# Patient Record
Sex: Male | Born: 1960 | Race: Black or African American | Hispanic: No | Marital: Married | Smoking: Never smoker
Health system: Southern US, Community
[De-identification: ages and names within clinical notes are randomized; demographics above are authoritative.]

## PROBLEM LIST (undated history)

## (undated) DIAGNOSIS — I10 Essential (primary) hypertension: Secondary | ICD-10-CM

## (undated) DIAGNOSIS — E785 Hyperlipidemia, unspecified: Secondary | ICD-10-CM

## (undated) HISTORY — DX: Hyperlipidemia, unspecified: E78.5

## (undated) HISTORY — PX: HERNIA REPAIR: SHX51

## (undated) HISTORY — DX: Essential (primary) hypertension: I10

---

## 2008-02-27 ENCOUNTER — Ambulatory Visit (HOSPITAL_BASED_OUTPATIENT_CLINIC_OR_DEPARTMENT_OTHER): Admission: RE | Admit: 2008-02-27 | Discharge: 2008-02-27 | Payer: Self-pay | Admitting: General Surgery

## 2010-06-07 ENCOUNTER — Encounter: Payer: Self-pay | Admitting: Orthopedic Surgery

## 2010-09-28 NOTE — Op Note (Signed)
NAMETanveer, Brammer Haidyn                   ACCOUNT NO.:  [id number]   MEDICAL RECORD NO.:  [id number]          PATIENT TYPE:  AMB   LOCATION:  DSC                          FACILITY:  [facility]   PHYSICIAN:  Leonie Man, M.D.   DATE OF BIRTH:  1960/11/09   DATE OF PROCEDURE:  02/27/2008  DATE OF DISCHARGE:                               OPERATIVE REPORT   PREOPERATIVE DIAGNOSIS:  Recurrent right inguinal hernia.   POSTOPERATIVE DIAGNOSIS:  Recurrent right inguinal hernia.   PROCEDURE:  Repair of recurrent right inguinal hernia with mesh.   SURGEON:  Leonie Man, MD   ASSISTANT:  OR nurse.   ANESTHESIA:  General.   There were no specimens sent to the lab.   FINDINGS:  Recurrent right inguinal hernia at the area of the pubic  tubercle with annoying incarcerated bowel but incarcerated preperitoneal  fat.  The original repair was not done with mesh.   Estimated blood loss is minimal.   There were no complications noted during this operation.   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  He has  noted a right-sided groin bulge, which is minimally symptomatic at this  point and he comes for evaluation and treatment.  He understands the  risks and potential benefits of surgery, gives his consent for  operation.   PROCEDURE:  The patient was positioned supine, and following the  induction of satisfactory general anesthesia, the lower abdomen and  groin were prepped and draped to be included in a sterile operative  field.  Identification of the patient as Houston Zapien and the procedure is  to be done as right inguinal hernia was carried out.  Preoperative  prophylactic antibiotics were given.  I made a transversely-placed right  groin incision through the old scar cicatrix dissecting through this  down to the external oblique aponeurosis.  The external oblique  aponeurosis was opened up through the  external inguinal ring with  isolation of the spermatic cord.  The spermatic cord was dissected free  up from the floor of the inguinal canal and held with a Penrose drain.  The incarcerated portion of the hernia was the most distal tip near the  pubic tubercle.  I had to remove a few of the repairing stitches so as  to reduce the hernia back into the retroperitoneum.  This area was then  closed primarily with a running suture of 2-0 Novofil.  I then placed an  onlay patch over the hernia and sewed this in, starting at the pubic  tubercle carrying this along the conjoined tendon up to the internal  ring and again from the pubic tubercle up along the shelving edge of  Poupart's ligament up to the internal ring.  The tails of the mesh were  then split and so as to allow passage of the spermatic cord between the  leaflets.  The leaflets of the mesh were then placed up under the  external oblique aponeurosis on top of the internal oblique and then  sewn into the internal oblique with additional sutures.  Sponge,  instrument, and sharp counts were then verified.  I closed the external  oblique aponeurosis over the cord with a running 2-0 Vicryl sutures so  as to  reapproximate the external inguinal ring.  The subcutaneous tissues and  Scarpa fascia was closed with running 3-0 Vicryl suture and the skin was  closed with running 4-0 Monocryl suture and then reinforced with Steri-  Strips and Dermabond.      Leonie Man, M.D.  Electronically Signed     PB/MEDQ  D:  02/27/2008  T:  02/27/2008  Job:  [id number]

## 2011-02-15 LAB — POCT I-STAT, CHEM 8
BUN: 21
Calcium, Ion: 1.21
Creatinine, Ser: 1.2
Glucose, Bld: 91
HCT: 47
Potassium: 4.4

## 2011-12-13 ENCOUNTER — Other Ambulatory Visit: Payer: Self-pay | Admitting: Internal Medicine

## 2011-12-13 DIAGNOSIS — N63 Unspecified lump in unspecified breast: Secondary | ICD-10-CM

## 2011-12-13 DIAGNOSIS — N644 Mastodynia: Secondary | ICD-10-CM

## 2011-12-20 ENCOUNTER — Ambulatory Visit
Admission: RE | Admit: 2011-12-20 | Discharge: 2011-12-20 | Disposition: A | Discharge status: Home / Self Care | Payer: BC Managed Care – PPO | Source: Ambulatory Visit | Attending: Internal Medicine | Admitting: Internal Medicine

## 2011-12-20 DIAGNOSIS — N644 Mastodynia: Secondary | ICD-10-CM

## 2011-12-20 DIAGNOSIS — N63 Unspecified lump in unspecified breast: Secondary | ICD-10-CM

## 2013-08-29 ENCOUNTER — Encounter (HOSPITAL_COMMUNITY): Payer: Self-pay | Admitting: Emergency Medicine

## 2013-08-29 ENCOUNTER — Inpatient Hospital Stay (HOSPITAL_COMMUNITY)
Admission: EM | Admit: 2013-08-29 | Discharge: 2013-09-01 | DRG: 700 | Disposition: A | Discharge status: Home / Self Care | Payer: BC Managed Care – PPO | Attending: Internal Medicine | Admitting: Internal Medicine

## 2013-08-29 DIAGNOSIS — N280 Ischemia and infarction of kidney: Secondary | ICD-10-CM | POA: Diagnosis present

## 2013-08-29 DIAGNOSIS — E785 Hyperlipidemia, unspecified: Secondary | ICD-10-CM | POA: Diagnosis present

## 2013-08-29 DIAGNOSIS — R030 Elevated blood-pressure reading, without diagnosis of hypertension: Secondary | ICD-10-CM

## 2013-08-29 DIAGNOSIS — IMO0001 Reserved for inherently not codable concepts without codable children: Secondary | ICD-10-CM | POA: Diagnosis present

## 2013-08-29 DIAGNOSIS — R509 Fever, unspecified: Secondary | ICD-10-CM

## 2013-08-29 DIAGNOSIS — I10 Essential (primary) hypertension: Secondary | ICD-10-CM | POA: Diagnosis present

## 2013-08-29 DIAGNOSIS — N2889 Other specified disorders of kidney and ureter: Principal | ICD-10-CM | POA: Diagnosis present

## 2013-08-29 LAB — CBC WITH DIFFERENTIAL/PLATELET
Basophils Absolute: 0 10*3/uL (ref 0.0–0.1)
Basophils Relative: 0 % (ref 0–1)
EOS ABS: 0 10*3/uL (ref 0.0–0.7)
EOS PCT: 0 % (ref 0–5)
HCT: 44.2 % (ref 39.0–52.0)
Hemoglobin: 14.8 g/dL (ref 13.0–17.0)
LYMPHS ABS: 1.2 10*3/uL (ref 0.7–4.0)
LYMPHS PCT: 11 % — AB (ref 12–46)
MCH: 27.3 pg (ref 26.0–34.0)
MCHC: 33.5 g/dL (ref 30.0–36.0)
MCV: 81.5 fL (ref 78.0–100.0)
Monocytes Absolute: 0.3 10*3/uL (ref 0.1–1.0)
Monocytes Relative: 2 % — ABNORMAL LOW (ref 3–12)
NEUTROS PCT: 87 % — AB (ref 43–77)
Neutro Abs: 9.4 10*3/uL — ABNORMAL HIGH (ref 1.7–7.7)
PLATELETS: 200 10*3/uL (ref 150–400)
RBC: 5.42 MIL/uL (ref 4.22–5.81)
RDW: 14.5 % (ref 11.5–15.5)
WBC: 10.9 10*3/uL — AB (ref 4.0–10.5)

## 2013-08-29 LAB — URINALYSIS, ROUTINE W REFLEX MICROSCOPIC
BILIRUBIN URINE: NEGATIVE
GLUCOSE, UA: NEGATIVE mg/dL
HGB URINE DIPSTICK: NEGATIVE
Ketones, ur: NEGATIVE mg/dL
Leukocytes, UA: NEGATIVE
NITRITE: NEGATIVE
PH: 5.5 (ref 5.0–8.0)
Protein, ur: NEGATIVE mg/dL
Specific Gravity, Urine: 1.028 (ref 1.005–1.030)
Urobilinogen, UA: 0.2 mg/dL (ref 0.0–1.0)

## 2013-08-29 LAB — COMPREHENSIVE METABOLIC PANEL
ALBUMIN: 4.1 g/dL (ref 3.5–5.2)
ALK PHOS: 55 U/L (ref 39–117)
ALT: 19 U/L (ref 0–53)
AST: 24 U/L (ref 0–37)
BILIRUBIN TOTAL: 0.3 mg/dL (ref 0.3–1.2)
BUN: 14 mg/dL (ref 6–23)
CHLORIDE: 97 meq/L (ref 96–112)
CO2: 23 mEq/L (ref 19–32)
Calcium: 9.5 mg/dL (ref 8.4–10.5)
Creatinine, Ser: 1.13 mg/dL (ref 0.50–1.35)
GFR calc Af Amer: 85 mL/min — ABNORMAL LOW (ref >90)
GFR, EST NON AFRICAN AMERICAN: 73 mL/min — AB (ref >90)
Glucose, Bld: 111 mg/dL — ABNORMAL HIGH (ref 70–99)
Potassium: 3.8 mEq/L (ref 3.7–5.3)
SODIUM: 137 meq/L (ref 137–147)
TOTAL PROTEIN: 7.7 g/dL (ref 6.0–8.3)

## 2013-08-29 MED ORDER — ONDANSETRON 4 MG PO TBDP
8.0000 mg | ORAL_TABLET | Freq: Once | ORAL | Status: AC
  Administered 2013-08-29: 8 mg via ORAL
  Filled 2013-08-29: qty 2

## 2013-08-29 MED ORDER — SODIUM CHLORIDE 0.9 % IV BOLUS (SEPSIS)
1000.0000 mL | Freq: Once | INTRAVENOUS | Status: AC
  Administered 2013-08-29: 1000 mL via INTRAVENOUS

## 2013-08-29 MED ORDER — OXYCODONE-ACETAMINOPHEN 5-325 MG PO TABS
1.0000 | ORAL_TABLET | Freq: Once | ORAL | Status: AC
  Administered 2013-08-29: 1 via ORAL
  Filled 2013-08-29: qty 1

## 2013-08-29 MED ORDER — MORPHINE SULFATE 4 MG/ML IJ SOLN
4.0000 mg | Freq: Once | INTRAMUSCULAR | Status: AC
  Administered 2013-08-29: 4 mg via INTRAVENOUS
  Filled 2013-08-29: qty 1

## 2013-08-29 MED ORDER — IOHEXOL 300 MG/ML  SOLN
25.0000 mL | Freq: Once | INTRAMUSCULAR | Status: AC | PRN
  Administered 2013-08-30: 25 mL via ORAL

## 2013-08-29 NOTE — ED Provider Notes (Addendum)
CSN: [id number]     Arrival date & time 08/29/13  1651 History   First MD Initiated Contact with Patient 08/29/13 2307     Chief Complaint  Patient presents with  . Abdominal Pain     (Consider location/radiation/quality/duration/timing/severity/associated sxs/prior Treatment) HPI This patient is a 53 year old man who presents with increasingly severe right lower quadrant abdominal pain which began about 12 h ours ago.   The pain is currently 10 over 10 in severity. The pain is nonradiating. The patient hasn't been nauseated but denies vomiting. His appetite has been very diminished. Less by mouth intake with breakfast. She has not appreciated a fever. Denies dysuria. No diarrhea or change in stools.  Nothing makes pain worse or better. Patient denies history of similar symptoms. Only previous abdominal surgery is a right inguinal hernia repair.  History reviewed. No pertinent past medical history. Past Surgical History  Procedure Laterality Date  . Hernia repair     History reviewed. No pertinent family history. History  Substance Use Topics  . Smoking status: Never Smoker   . Smokeless tobacco: Not on file  . Alcohol Use: No    Review of Systems Ten point review of symptoms performed and is negative with the exception of symptoms noted above.     Allergies  Review of patient's allergies indicates no known allergies.  Home Medications   Prior to Admission medications   Medication Sig Start Date End Date Taking? Authorizing Provider  Ascorbic Acid (VITAMIN C) 250 MG CHEW Chew 3 tablets by mouth daily.   Yes Historical Provider, MD  Multiple Vitamin (MULTI-VITAMIN PO) Take 2 tablets by mouth 2 (two) times daily. "Alleviate"   Yes Historical Provider, MD   BP 180/104  Pulse 77  Temp(Src) 99.2 F (37.3 C) (Oral)  Resp 20  Ht 6\' 6"  (1.981 m)  Wt 242 lb (109.77 kg)  BMI 27.97 kg/m2  SpO2 99% Physical Exam Gen: well developed and well nourished appearing man appears  mildly uncomfortable Head: NCAT Eyes: PERL, EOMI Nose: no epistaixis or rhinorrhea Mouth/throat: mucosa is moist and pink Neck: supple, no stridor Lungs: CTA B, no wheezing, rhonchi or rales CV: RRR, no murmur, extremities appear well perfused.  Abd: soft, tenderness over McBurney's point, no peritoneal signs, nondistended. Positive psoas and obturator signs Back: no ttp, no cva ttp Skin: warm and dry Ext: normal to inspection, no dependent edema Neuro: CN ii-xii grossly intact, no focal deficits Psyche; normal affect,  calm and cooperative.   ED Course  Procedures (including critical care time) Labs Review  Results for orders placed during the hospital encounter of 08/29/13 (from the past 24 hour(s))  CBC WITH DIFFERENTIAL     Status: Abnormal   Collection Time    08/29/13  5:30 PM      Result Value Ref Range   WBC 10.9 (*) 4.0 - 10.5 K/uL   RBC 5.42  4.22 - 5.81 MIL/uL   Hemoglobin 14.8  13.0 - 17.0 g/dL   HCT 40.944.2  81.139.0 - 91.452.0 %   MCV 81.5  78.0 - 100.0 fL   MCH 27.3  26.0 - 34.0 pg   MCHC 33.5  30.0 - 36.0 g/dL   RDW 78.214.5  95.611.5 - 21.315.5 %   Platelets 200  150 - 400 K/uL   Neutrophils Relative % 87 (*) 43 - 77 %   Neutro Abs 9.4 (*) 1.7 - 7.7 K/uL   Lymphocytes Relative 11 (*) 12 - 46 %   Lymphs Abs  1.2  0.7 - 4.0 K/uL   Monocytes Relative 2 (*) 3 - 12 %   Monocytes Absolute 0.3  0.1 - 1.0 K/uL   Eosinophils Relative 0  0 - 5 %   Eosinophils Absolute 0.0  0.0 - 0.7 K/uL   Basophils Relative 0  0 - 1 %   Basophils Absolute 0.0  0.0 - 0.1 K/uL  COMPREHENSIVE METABOLIC PANEL     Status: Abnormal   Collection Time    08/29/13  5:30 PM      Result Value Ref Range   Sodium 137  137 - 147 mEq/L   Potassium 3.8  3.7 - 5.3 mEq/L   Chloride 97  96 - 112 mEq/L   CO2 23  19 - 32 mEq/L   Glucose, Bld 111 (*) 70 - 99 mg/dL   BUN 14  6 - 23 mg/dL   Creatinine, Ser 6.571.13  0.50 - 1.35 mg/dL   Calcium 9.5  8.4 - 84.610.5 mg/dL   Total Protein 7.7  6.0 - 8.3 g/dL   Albumin 4.1  3.5 -  5.2 g/dL   AST 24  0 - 37 U/L   ALT 19  0 - 53 U/L   Alkaline Phosphatase 55  39 - 117 U/L   Total Bilirubin 0.3  0.3 - 1.2 mg/dL   GFR calc non Af Amer 73 (*) >90 mL/min   GFR calc Af Amer 85 (*) >90 mL/min  URINALYSIS, ROUTINE W REFLEX MICROSCOPIC     Status: None   Collection Time    08/29/13 10:56 PM      Result Value Ref Range   Color, Urine YELLOW  YELLOW   APPearance CLEAR  CLEAR   Specific Gravity, Urine 1.028  1.005 - 1.030   pH 5.5  5.0 - 8.0   Glucose, UA NEGATIVE  NEGATIVE mg/dL   Hgb urine dipstick NEGATIVE  NEGATIVE   Bilirubin Urine NEGATIVE  NEGATIVE   Ketones, ur NEGATIVE  NEGATIVE mg/dL   Protein, ur NEGATIVE  NEGATIVE mg/dL   Urobilinogen, UA 0.2  0.0 - 1.0 mg/dL   Nitrite NEGATIVE  NEGATIVE   Leukocytes, UA NEGATIVE  NEGATIVE    CT Abdomen Pelvis W Contrast (Final result)  Result time: 08/30/13 01:07:12    Final result by Rad Results In Interface (08/30/13 01:07:12)    Narrative:   CLINICAL DATA: Right lower quadrant pain  EXAM: CT ABDOMEN AND PELVIS WITH CONTRAST  TECHNIQUE: Multidetector CT imaging of the abdomen and pelvis was performed using the standard protocol following bolus administration of intravenous contrast.  CONTRAST: 100mL OMNIPAQUE IOHEXOL 300 MG/ML SOLN  COMPARISON: No comparisons  FINDINGS: The lung bases are clear.  The liver demonstrates no focal abnormality. There is no intrahepatic or extrahepatic biliary ductal dilatation. The gallbladder is normal. The spleen demonstrates no focal abnormality.There is a segment of the anterior right kidney demonstrating lack of enhancement which is well demarcated from the normally enhancing remainder of the right kidney with the overall appearance most concerning for right renal infarct. There is a 6 mm hypodensity in the upper pole of the right kidney which is too small to characterize. There is a hypodense, fluid attenuating 2.3 cm left renal mass most consistent with a cyst. The  adrenal glands and pancreas are normal. The bladder is unremarkable.  The stomach, duodenum, small intestine, and large intestine demonstrate no gross abnormality. Majority of the contrast is within the stomach. There is a normal caliber appendix in the right  lower quadrant without periappendiceal inflammatory changes. There is no pneumoperitoneum, pneumatosis, or portal venous gas. There is no abdominal or pelvic free fluid. There is no lymphadenopathy.  The abdominal aorta is normal in caliber.  There are no lytic or sclerotic osseous lesions. 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  IMPRESSION: 1. 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     EKG: nsr, no acute ischemic changes, normal intervals, normal axis, normal qrs complex  MDM  DDX: gastritis, PUD, GERD, appendicitis, pancreatitis, gallbladder disease, SBO, colitis, UTI, enteritis.   Exam notable for RLQ ttp with positive obturator and psoas signs and mild leukocytosis along with classic sx of appendicitis. CT abd/pelvis for confirmation and symptomatic management.   CT scan shows right renal artery infarct - age indeterminate. In light of acute onset of pain, will admit for further evaluation upon the assumption that this is an acute process.     Brandt Loosen, MD 08/30/13 0202  Approx 0215: Case discussed with Dr. Toniann Fail who has accepted the patient as an observation admission.   Brandt Loosen, MD 08/30/13 [phone]

## 2013-08-29 NOTE — ED Notes (Signed)
He woke this am with RLQ abd pain, nausea and a few episodes of diarrhea. He denies urinary symptoms. He is A&Ox4, resp e/u

## 2013-08-29 NOTE — ED Notes (Addendum)
Pt appreciative of nausea medication, pt updated on wait time, pt verbalized understanding - pt reports continued pain, pt advised that when nausea has improved to inform this RN so pain medication may be administered at that time.

## 2013-08-30 ENCOUNTER — Emergency Department (HOSPITAL_COMMUNITY): Payer: BC Managed Care – PPO

## 2013-08-30 ENCOUNTER — Encounter (HOSPITAL_COMMUNITY): Payer: Self-pay | Admitting: Radiology

## 2013-08-30 DIAGNOSIS — I517 Cardiomegaly: Secondary | ICD-10-CM

## 2013-08-30 DIAGNOSIS — N280 Ischemia and infarction of kidney: Secondary | ICD-10-CM | POA: Diagnosis present

## 2013-08-30 DIAGNOSIS — R030 Elevated blood-pressure reading, without diagnosis of hypertension: Secondary | ICD-10-CM

## 2013-08-30 DIAGNOSIS — IMO0001 Reserved for inherently not codable concepts without codable children: Secondary | ICD-10-CM | POA: Diagnosis present

## 2013-08-30 LAB — CBC WITH DIFFERENTIAL/PLATELET
BASOS PCT: 0 % (ref 0–1)
Basophils Absolute: 0 10*3/uL (ref 0.0–0.1)
EOS PCT: 0 % (ref 0–5)
Eosinophils Absolute: 0 10*3/uL (ref 0.0–0.7)
HEMATOCRIT: 41.5 % (ref 39.0–52.0)
Hemoglobin: 14 g/dL (ref 13.0–17.0)
LYMPHS PCT: 25 % (ref 12–46)
Lymphs Abs: 2.7 10*3/uL (ref 0.7–4.0)
MCH: 27.5 pg (ref 26.0–34.0)
MCHC: 33.7 g/dL (ref 30.0–36.0)
MCV: 81.4 fL (ref 78.0–100.0)
MONO ABS: 1 10*3/uL (ref 0.1–1.0)
Monocytes Relative: 9 % (ref 3–12)
NEUTROS ABS: 7.1 10*3/uL (ref 1.7–7.7)
Neutrophils Relative %: 66 % (ref 43–77)
PLATELETS: 204 10*3/uL (ref 150–400)
RBC: 5.1 MIL/uL (ref 4.22–5.81)
RDW: 14.6 % (ref 11.5–15.5)
WBC: 10.8 10*3/uL — AB (ref 4.0–10.5)

## 2013-08-30 LAB — SEDIMENTATION RATE: Sed Rate: 2 mm/hr (ref 0–16)

## 2013-08-30 LAB — COMPREHENSIVE METABOLIC PANEL
ALT: 26 U/L (ref 0–53)
AST: 34 U/L (ref 0–37)
Albumin: 3.5 g/dL (ref 3.5–5.2)
Alkaline Phosphatase: 50 U/L (ref 39–117)
BILIRUBIN TOTAL: 0.4 mg/dL (ref 0.3–1.2)
BUN: 14 mg/dL (ref 6–23)
CHLORIDE: 101 meq/L (ref 96–112)
CO2: 23 meq/L (ref 19–32)
Calcium: 9 mg/dL (ref 8.4–10.5)
Creatinine, Ser: 1.2 mg/dL (ref 0.50–1.35)
GFR calc non Af Amer: 68 mL/min — ABNORMAL LOW (ref >90)
GFR, EST AFRICAN AMERICAN: 79 mL/min — AB (ref >90)
GLUCOSE: 98 mg/dL (ref 70–99)
POTASSIUM: 3.8 meq/L (ref 3.7–5.3)
SODIUM: 137 meq/L (ref 137–147)
Total Protein: 6.9 g/dL (ref 6.0–8.3)

## 2013-08-30 LAB — ANTITHROMBIN III: AntiThromb III Func: 96 % (ref 75–120)

## 2013-08-30 LAB — HOMOCYSTEINE: Homocysteine: 9.2 umol/L (ref 4.0–15.4)

## 2013-08-30 LAB — LACTATE DEHYDROGENASE: LDH: 368 U/L — AB (ref 94–250)

## 2013-08-30 MED ORDER — MORPHINE SULFATE 4 MG/ML IJ SOLN
4.0000 mg | Freq: Once | INTRAMUSCULAR | Status: AC
  Administered 2013-08-30: 4 mg via INTRAVENOUS
  Filled 2013-08-30: qty 1

## 2013-08-30 MED ORDER — HYDRALAZINE HCL 20 MG/ML IJ SOLN
10.0000 mg | INTRAMUSCULAR | Status: DC | PRN
  Administered 2013-08-31 (×2): 10 mg via INTRAVENOUS
  Filled 2013-08-30 (×2): qty 1

## 2013-08-30 MED ORDER — ACETAMINOPHEN 650 MG RE SUPP: 650.0000 mg | Freq: Four times a day (QID) | RECTAL | Status: DC | PRN

## 2013-08-30 MED ORDER — SODIUM CHLORIDE 0.9 % IJ SOLN
3.0000 mL | Freq: Two times a day (BID) | INTRAMUSCULAR | Status: DC
  Administered 2013-08-30 – 2013-08-31 (×2): 3 mL via INTRAVENOUS

## 2013-08-30 MED ORDER — ENOXAPARIN SODIUM 40 MG/0.4ML ~~LOC~~ SOLN
40.0000 mg | Freq: Every day | SUBCUTANEOUS | Status: DC
  Administered 2013-08-30 – 2013-08-31 (×2): 40 mg via SUBCUTANEOUS
  Filled 2013-08-30 (×2): qty 0.4

## 2013-08-30 MED ORDER — ACETAMINOPHEN 325 MG PO TABS
650.0000 mg | ORAL_TABLET | Freq: Four times a day (QID) | ORAL | Status: DC | PRN
  Administered 2013-08-31 (×2): 650 mg via ORAL
  Filled 2013-08-30 (×2): qty 2

## 2013-08-30 MED ORDER — ONDANSETRON HCL 4 MG/2ML IJ SOLN: 4.0000 mg | Freq: Four times a day (QID) | INTRAMUSCULAR | Status: DC | PRN

## 2013-08-30 MED ORDER — IOHEXOL 300 MG/ML  SOLN
100.0000 mL | Freq: Once | INTRAMUSCULAR | Status: AC | PRN
  Administered 2013-08-30: 100 mL via INTRAVENOUS

## 2013-08-30 MED ORDER — SODIUM CHLORIDE 0.9 % IV SOLN
INTRAVENOUS | Status: AC
  Administered 2013-08-30 (×2): via INTRAVENOUS
  Administered 2013-08-30: 100 mL/h via INTRAVENOUS

## 2013-08-30 MED ORDER — MORPHINE SULFATE 2 MG/ML IJ SOLN
1.0000 mg | INTRAMUSCULAR | Status: DC | PRN
  Administered 2013-08-30 (×2): 1 mg via INTRAVENOUS
  Filled 2013-08-30 (×2): qty 1

## 2013-08-30 MED ORDER — ONDANSETRON HCL 4 MG PO TABS: 4.0000 mg | ORAL_TABLET | Freq: Four times a day (QID) | ORAL | Status: DC | PRN

## 2013-08-30 NOTE — Progress Notes (Signed)
Utilization review completed.  

## 2013-08-30 NOTE — ED Notes (Signed)
Report given to [facility] [place] nurse , transported in stable condition , denies pain at this time / respirations unlabored . IV site intact.

## 2013-08-30 NOTE — ED Notes (Signed)
Patient transported to CT 

## 2013-08-30 NOTE — Progress Notes (Signed)
CARE MANAGEMENT NOTE 08/30/2013  Patient:  Nicholas Pace,Nicholas Pace   Account Number:  [id number]  Date Initiated:  08/30/2013  Documentation initiated by:  Darlyne RussianOLAND,Madox Corkins  Subjective/Objective Assessment:   admitted with c/o right flank pain, found to have right renal infarct     Action/Plan:   Anticipated DC Date:  09/02/2013   Anticipated DC Plan:  HOME/SELF CARE         Choice offered to / List presented to:             Status of service:  In process, will continue to follow Medicare Important Message given?   (If response is "NO", the following Medicare IM given date fields will be blank) Date Medicare IM given:   Date Additional Medicare IM given:    Discharge Disposition:    Per UR Regulation:  Reviewed for med. necessity/level of care/duration of stay  If discussed at Long Length of Stay Meetings, dates discussed:    Comments:

## 2013-08-30 NOTE — Progress Notes (Addendum)
Received order for Renal Artery Duplex. Spoke with Ty, RN who informed us the patient had eaten lunch already.  Per department protocol, patient needs to be NPO for 6-8 hours prior to test. Therefore test cannot be completed today due to department hours. Test will be completed first thing in the morning, 4/18. Ty, RN will make patient NPO after midnight and inform Dr. Waymon AmatoHongalgi.    Farrel DemarkJill Eunice, RDMS, RVT 08/30/2013 3:13 PM

## 2013-08-30 NOTE — Progress Notes (Addendum)
Pt orientation to unit, room and routine. Information packet given to patient and safety video watched.  Admission INP armband ID verified with patient and in place. Side rails in place, fall risk assessment complete with patient verbalizing understanding of risks associated with falls. Pt verbalizes an understanding of how to use the call bell and to call for help before getting out of bed.   Will cont to monitor and assist as needed.  Gilman SchmidtJanice J Colene [place], RN

## 2013-08-30 NOTE — H&P (Signed)
[facility] History and Physical  Nicholas Pace FAO:[id number]:[id number] DOB: 04/25/1961 DOA: 08/29/2013  Referring physician: ER physician. PCP: No primary provider on file.   Chief Complaint: Right flank pain.  HPI: Nicholas Pace is a 53 y.o. male with no significant past medical history presents to the ER with complaints of right flank pain. Patient has been having this pain since yesterday morning which started suddenly. Patient has been having some hiccups and nausea but denies any vomiting or diarrhea. Denies any fever though he has had some chills. Denies any palpitations focal deficits. In the ER CT abdomen pelvis done shows right sided renal infarct. EKG shows normal sinus rhythm. Patient is admitted for further workup. Pain is being controlled with pain relief medications.   Review of Systems: As presented in the history of presenting illness, rest negative.  History reviewed. No pertinent past medical history. Past Surgical History  Procedure Laterality Date  . Hernia repair     Social History:  reports that he has never smoked. He does not have any smokeless tobacco history on file. He reports that he does not drink alcohol or use illicit drugs. Where does patient live home. Can patient participate in ADLs? Yes.  No Known Allergies  Family History:  Family History  Problem Relation Age of Onset  . CAD Neg Hx       Prior to Admission medications   Medication Sig Start Date End Date Taking? Authorizing Provider  Ascorbic Acid (VITAMIN C) 250 MG CHEW Chew 3 tablets by mouth daily.   Yes Historical Provider, MD  Multiple Vitamin (MULTI-VITAMIN PO) Take 2 tablets by mouth 2 (two) times daily. "Alleviate"   Yes Historical Provider, MD    Physical Exam: Filed Vitals:   08/30/13 0155 08/30/13 0203 08/30/13 0300 08/30/13 0311  BP: 174/97 160/99 161/97 161/97  Pulse: 74 73 80 71  Temp:  99 F (37.2 C)    TempSrc:  Oral    Resp:  14 14 12   Height:      Weight:      SpO2: 99% 98%  97% 98%     General:  Well-developed and nourished.  Eyes: Anicteric no pallor.  ENT: No discharge from the ears eyes nose mouth.  Neck: No mass felt.  Cardiovascular: S1-S2 heard.  Respiratory: No rhonchi or crepitations.  Abdomen: Soft nontender bowel sounds present no guarding rigidity.  Skin: No rash.  Musculoskeletal: No edema.  Psychiatric: Appears normal.  Neurologic: Alert. Oriented to time place and person. Moves all extremities.  Labs on Admission:  Basic Metabolic Panel:  Recent Labs Lab 08/29/13 1730  NA 137  K 3.8  CL 97  CO2 23  GLUCOSE 111*  BUN 14  CREATININE 1.13  CALCIUM 9.5   Liver Function Tests:  Recent Labs Lab 08/29/13 1730  AST 24  ALT 19  ALKPHOS 55  BILITOT 0.3  PROT 7.7  ALBUMIN 4.1   No results found for this basename: LIPASE, AMYLASE,  in the last 168 hours No results found for this basename: AMMONIA,  in the last 168 hours CBC:  Recent Labs Lab 08/29/13 1730  WBC 10.9*  NEUTROABS 9.4*  HGB 14.8  HCT 44.2  MCV 81.5  PLT 200   Cardiac Enzymes: No results found for this basename: CKTOTAL, CKMB, CKMBINDEX, TROPONINI,  in the last 168 hours  BNP (last 3 results) No results found for this basename: PROBNP,  in the last 8760 hours CBG: No results found for this basename: GLUCAP,  in the last 168 hours  Radiological Exams on Admission: Ct Abdomen Pelvis W Contrast  08/30/2013   CLINICAL DATA:  Right lower quadrant pain  EXAM: CT ABDOMEN AND PELVIS WITH CONTRAST  TECHNIQUE: Multidetector CT imaging of the abdomen and pelvis was performed using the standard protocol following bolus administration of intravenous contrast.  CONTRAST:  100mL OMNIPAQUE IOHEXOL 300 MG/ML  SOLN  COMPARISON:  No comparisons  FINDINGS: The lung bases are clear.  The liver demonstrates no focal abnormality. There is no intrahepatic or extrahepatic biliary ductal dilatation. The gallbladder is normal. The spleen demonstrates no focal  abnormality.There is a segment of the anterior right kidney demonstrating lack of enhancement which is well demarcated from the normally enhancing remainder of the right kidney with the overall appearance most concerning for right renal infarct. There is a 6 mm hypodensity in the upper pole of the right kidney which is too small to characterize. There is a hypodense, fluid attenuating 2.3 cm left renal mass most consistent with a cyst. The adrenal glands and pancreas are normal. The bladder is unremarkable.  The stomach, duodenum, small intestine, and large intestine demonstrate no gross abnormality. Majority of the contrast is within the stomach. There is a normal caliber appendix in the right lower quadrant without periappendiceal inflammatory changes. There is no pneumoperitoneum, pneumatosis, or portal venous gas. There is no abdominal or pelvic free fluid. There is no lymphadenopathy.  The abdominal aorta is normal in caliber.  There are no lytic or sclerotic osseous lesions. 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  IMPRESSION: 1. 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   Electronically Signed   By: Elige KoHetal  Patel   On: 08/30/2013 01:07    EKG: Independently reviewed. Normal sinus rhythm.  Assessment/Plan Principal Problem:   Renal infarct   1. Right-sided renal infarct - I have ordered 2-D echocardiogram to check for any embolic process, hypercoagulable workup, LDH, blood cultures. I have discussed with on-call urologist Dr. Vernie Ammonsttelin who had advised to get vascular surgery input. I have discussed with Dr. Imogene Burnhen, vascular surgeon who has advised in addition to above tests to get Doppler of the renal artery and also to check for renal vein thrombosis. I have ordered Doppler of the renal artery and in the same time check for renal vein. Dr. Imogene Burnhen advised to reconsult vascular surgery in a.m. after Doppler  of the renal artery done. Dr. Imogene Burnhen advised that there is no role for IV heparin infusion at this time but to closely monitor for any atrial fibrillation. Continued with pain with medications and gentle hydration. 2. Elevated blood pressure - patient has been placed on when necessary IV hydralazine for systolic blood pressure more than 160.    Code Status: Full code.  Family Communication: Wife at the bedside.  Disposition Plan: Admit to inpatient.    Eduard ClosArshad N Kakrakandy [facility] Pager [phone].  If 7PM-7AM, please contact night-coverage [email or web address] Password [email or web address] 08/30/2013, 3:41 AM

## 2013-08-30 NOTE — Progress Notes (Signed)
  Echocardiogram 2D Echocardiogram has been performed.  Nicholas Pace 08/30/2013, 1:22 PM

## 2013-08-30 NOTE — Progress Notes (Addendum)
PROGRESS NOTE    Nicholas Pace YNW:[id number] DOB: 05/24/1960 DOA: 08/29/2013 PCP: No primary provider on file.  HPI/Brief narrative 53 y.o. male with no significant PMH presents to the ER with complaints of right flank pain and CT abdomen pelvis done showed right sided renal infarct.   Assessment/Plan:  1. Right renal infarct: Unclear etiology. Followup hypercoagulable panel, renal artery and venous Dopplers. Echo shows normal LVEF. Abdominal pain better. Admitting M.D. had discussed with urology and [facility]VS. 2. Elevated BP/? HTN: prn IV Hydralazine   Code Status: Full Family Communication: Discussed with spouse at bedside. Disposition Plan: Home when medically stable   Consultants:  None  Procedures:  None  Antibiotics:  None   Subjective: Improved right-sided flank/abdominal pain  Objective: Filed Vitals:   08/30/13 0311 08/30/13 0347 08/30/13 0747 08/30/13 1208  BP: 161/97 153/96 150/92 158/90  Pulse: 71 79 64 77  Temp:  98.7 F (37.1 C) 98.4 F (36.9 C) 98.3 F (36.8 C)  TempSrc:  Oral Oral Oral  Resp: 12 14 15 16   Height:  6\' 6"  (1.981 m)    Weight:  110 kg (242 lb 8.1 oz)    SpO2: 98% 100% 96% 96%    Intake/Output Summary (Last 24 hours) at 08/30/13 1650 Last data filed at 08/30/13 1405  Gross per 24 hour  Intake   1000 ml  Output    700 ml  Net    300 ml   Filed Weights   08/29/13 1727 08/30/13 0347  Weight: 109.77 kg (242 lb) 110 kg (242 lb 8.1 oz)     Exam:  General exam: Pleasant young male lying comfortably in bed Respiratory system: Clear. No increased work of breathing. Cardiovascular system: S1 & S2 heard, RRR. No JVD, murmurs, gallops, clicks or pedal edema. Tele: SR Gastrointestinal system: Abdomen is nondistended, soft and nontender. Normal bowel sounds heard. Central nervous system: Alert and oriented. No focal neurological deficits. Extremities: Symmetric 5 x 5 power.   Data Reviewed: Basic Metabolic Panel:  Recent Labs Lab  08/29/13 1730 08/30/13 0650  NA 137 137  K 3.8 3.8  CL 97 101  CO2 23 23  GLUCOSE 111* 98  BUN 14 14  CREATININE 1.13 1.20  CALCIUM 9.5 9.0   Liver Function Tests:  Recent Labs Lab 08/29/13 1730 08/30/13 0650  AST 24 34  ALT 19 26  ALKPHOS 55 50  BILITOT 0.3 0.4  PROT 7.7 6.9  ALBUMIN 4.1 3.5   No results found for this basename: LIPASE, AMYLASE,  in the last 168 hours No results found for this basename: AMMONIA,  in the last 168 hours CBC:  Recent Labs Lab 08/29/13 1730 08/30/13 0650  WBC 10.9* 10.8*  NEUTROABS 9.4* 7.1  HGB 14.8 14.0  HCT 44.2 41.5  MCV 81.5 81.4  PLT 200 204   Cardiac Enzymes: No results found for this basename: CKTOTAL, CKMB, CKMBINDEX, TROPONINI,  in the last 168 hours BNP (last 3 results) No results found for this basename: PROBNP,  in the last 8760 hours CBG: No results found for this basename: GLUCAP,  in the last 168 hours  No results found for this or any previous visit (from the past 240 hour(s)).      Studies: Ct Abdomen Pelvis W Contrast  08/30/2013   CLINICAL DATA:  Right lower quadrant pain  EXAM: CT ABDOMEN AND PELVIS WITH CONTRAST  TECHNIQUE: Multidetector CT imaging of the abdomen and pelvis was performed using the standard protocol following bolus administration  of intravenous contrast.  CONTRAST:  100mL OMNIPAQUE IOHEXOL 300 MG/ML  SOLN  COMPARISON:  No comparisons  FINDINGS: The lung bases are clear.  The liver demonstrates no focal abnormality. There is no intrahepatic or extrahepatic biliary ductal dilatation. The gallbladder is normal. The spleen demonstrates no focal abnormality.There is a segment of the anterior right kidney demonstrating lack of enhancement which is well demarcated from the normally enhancing remainder of the right kidney with the overall appearance most concerning for right renal infarct. There is a 6 mm hypodensity in the upper pole of the right kidney which is too small to characterize. There is a  hypodense, fluid attenuating 2.3 cm left renal mass most consistent with a cyst. The adrenal glands and pancreas are normal. The bladder is unremarkable.  The stomach, duodenum, small intestine, and large intestine demonstrate no gross abnormality. Majority of the contrast is within the stomach. There is a normal caliber appendix in the right lower quadrant without periappendiceal inflammatory changes. There is no pneumoperitoneum, pneumatosis, or portal venous gas. There is no abdominal or pelvic free fluid. There is no lymphadenopathy.  The abdominal aorta is normal in caliber.  There are no lytic or sclerotic osseous lesions. 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  IMPRESSION: 1. 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   Electronically Signed   By: Elige KoHetal  Patel   On: 08/30/2013 01:07        Scheduled Meds: . enoxaparin (LOVENOX) injection  40 mg Subcutaneous Daily  . sodium chloride  3 mL Intravenous Q12H   Continuous Infusions: . sodium chloride 100 mL/hr at 08/30/13 1405    Principal Problem:   Renal infarct Active Problems:   Elevated blood pressure    Time spent: 25 mins    Elease EtienneAnand D Athea Haley, MD, FACP, [facility]. [facility] Pager [phone]  If 7PM-7AM, please contact night-coverage [email or web address] Password [email or web address] 08/30/2013, 4:50 PM    LOS: 1 day

## 2013-08-31 ENCOUNTER — Inpatient Hospital Stay (HOSPITAL_COMMUNITY): Payer: BC Managed Care – PPO

## 2013-08-31 ENCOUNTER — Encounter (HOSPITAL_COMMUNITY): Payer: Self-pay | Admitting: Radiology

## 2013-08-31 DIAGNOSIS — R799 Abnormal finding of blood chemistry, unspecified: Secondary | ICD-10-CM

## 2013-08-31 DIAGNOSIS — R509 Fever, unspecified: Secondary | ICD-10-CM

## 2013-08-31 DIAGNOSIS — N2889 Other specified disorders of kidney and ureter: Secondary | ICD-10-CM

## 2013-08-31 DIAGNOSIS — I10 Essential (primary) hypertension: Secondary | ICD-10-CM

## 2013-08-31 MED ORDER — IOHEXOL 350 MG/ML SOLN
80.0000 mL | Freq: Once | INTRAVENOUS | Status: AC | PRN
  Administered 2013-08-31: 75 mL via INTRAVENOUS

## 2013-08-31 MED ORDER — SODIUM CHLORIDE 0.9 % IV SOLN
INTRAVENOUS | Status: DC
  Administered 2013-08-31: 12:00:00 via INTRAVENOUS
  Administered 2013-08-31 – 2013-09-01 (×2): 125 mL/h via INTRAVENOUS

## 2013-08-31 MED ORDER — ASPIRIN EC 325 MG PO TBEC
325.0000 mg | DELAYED_RELEASE_TABLET | Freq: Every day | ORAL | Status: DC
  Administered 2013-08-31: 325 mg via ORAL
  Filled 2013-08-31: qty 1

## 2013-08-31 MED ORDER — ASPIRIN EC 81 MG PO TBEC
81.0000 mg | DELAYED_RELEASE_TABLET | Freq: Every day | ORAL | Status: DC
  Filled 2013-08-31: qty 1

## 2013-08-31 MED ORDER — RIVAROXABAN 15 MG PO TABS
15.0000 mg | ORAL_TABLET | Freq: Two times a day (BID) | ORAL | Status: DC
  Administered 2013-09-01: 15 mg via ORAL
  Filled 2013-08-31 (×3): qty 1

## 2013-08-31 NOTE — Progress Notes (Signed)
ANTICOAGULATION CONSULT NOTE - Initial Consult  Pharmacy Consult for Rivaroxaban Indication:  S/p renal infarct - felt to be from thrombus  No Known Allergies  Patient Measurements: Height: 6\' 6"  (198.1 cm) Weight: 242 lb 8.1 oz (110 kg) IBW/kg (Calculated) : 91.4  Vital Signs: Temp: 100.6 F (38.1 C) (04/18 1700) Temp src: Oral (04/18 1700) BP: 158/81 mmHg (04/18 1700) Pulse Rate: 72 (04/18 1700)  Labs:  Recent Labs  08/29/13 1730 08/30/13 0650  HGB 14.8 14.0  HCT 44.2 41.5  PLT 200 204  CREATININE 1.13 1.20   Estimated Creatinine Clearance: 100.6 ml/min (by C-G formula based on Cr of 1.2).  Medical History: History reviewed. No pertinent past medical history.  Medications:  Prescriptions prior to admission  Medication Sig Dispense Refill  . Ascorbic Acid (VITAMIN C) 250 MG CHEW Chew 3 tablets by mouth daily.      . Multiple Vitamin (MULTI-VITAMIN PO) Take 2 tablets by mouth 2 (two) times daily. "Alleviate"       Assessment: 52 yom admitted with flank pain and found to have a right sided renal infarct.  The CT noted a possible mural thrombus or very short-segment dissection.  No other abnormalities noted.  CBC stable as is platelets.  He is being worked up for hypercoagulable state with labs currently pending.  We have been asked to dose his rivaroxaban.  Goal of Therapy:  Therapeutic response to anticoagulation   Plan:  1.  Rivaroxaban 15 mg twice daily x 21 days then 20mg  daily. 2.  Monitor CBC and s/s of bleeding 3.  Will provide education prior to discharge.  Nadara MustardNita Torie Priebe, PharmD., MS Clinical Pharmacist Pager:  [phone] Thank you for allowing pharmacy to be part of this patients care team. 08/31/2013,7:07 PM

## 2013-08-31 NOTE — Consult Note (Addendum)
Consult Note  Patient name: Nicholas Pace MRN: [id number] DOB: 11/07/1960 Sex: male  Consulting Physician:  Hospital service  Reason for Consult:  Chief Complaint  Patient presents with  . Abdominal Pain    HISTORY OF PRESENT ILLNESS: 53 year old male who presented to the ER with right sided abdominal pain on 08/29/2013.  Ct scan revealed infarct in the anterior right kidney.  He does not use tobacco.  He denies a irregulr heart beat. He exercises regularly.  He has no history or family history hypercoaguability.  He was hypertensive in the ER.   History reviewed. No pertinent past medical history.  Past Surgical History  Procedure Laterality Date  . Hernia repair      History   Social History  . Marital Status: Married    Spouse Name: N/A    Number of Children: N/A  . Years of Education: N/A   Occupational History  . Not on file.   Social History Main Topics  . Smoking status: Never Smoker   . Smokeless tobacco: Not on file  . Alcohol Use: No  . Drug Use: No  . Sexual Activity: Not on file   Other Topics Concern  . Not on file   Social History Narrative  . No narrative on file    Family History  Problem Relation Age of Onset  . CAD Neg Hx     Allergies as of 08/29/2013  . (No Known Allergies)    No current facility-administered medications on file prior to encounter.   No current outpatient prescriptions on file prior to encounter.     REVIEW OF SYSTEMS: See HPI, otherwise positive for chronic knee pain.  All other systems hegative  PHYSICAL EXAMINATION: General: The patient appears their stated age.  Vital signs are BP 139/81  Pulse 81  Temp(Src) 100.6 F (38.1 C) (Oral)  Resp 18  Ht 6\' 6"  (1.981 m)  Wt 242 lb 8.1 oz (110 kg)  BMI 28.03 kg/m2  SpO2 98% Pulmonary: Respirations are non-labored HEENT:  No gross abnormalities Abdomen: Soft and non-tender  Musculoskeletal: There are no major deformities.   Neurologic: No focal weakness  or paresthesias are detected, Skin: There are no ulcer or rashes noted. Psychiatric: The patient has normal affect. Cardiovascular: There is a regular rate and rhythm without significant murmur appreciated. Palpable pedal pulses  Diagnostic Studies: I have reviewed his CTA of the chest abdomen and pelvis.  No obvious source of infarct is identified.  There is a focal abnormality in the right renal artery that could represent mural thrombus vs short segment dissection  Per report the ECHO was negative for atrial thrombus  EKG shows NSR      Assessment:  Right Renal infarct with no apparent cause Plan: Workup to date does not illustrate a source for the infarct.  Full hypercoag workup will not be completed for several days, given lab turn around.  The patient will need a cholesterol panel completed.  I would recommend control of his blood pressure with medications with a goal of < 140/90.  I think it would be reasonable to anticoagulate him for a short period of time (3-6 months), pending th efull results of his hypercoag workup..  In addition, he will need to start asprin therapy (81mg ).      Will schedule follow up in 3 months with repeat CTA chest, abdomen, pelvis.   Jorge NyV. Wells Lota Leamer IV, M.D. [facility] Office: [phone] Pager:  [phone]

## 2013-08-31 NOTE — Consult Note (Signed)
New Hematology/Oncology Consult   Referral MD: A. Hongalgi       Reason for Referral: Renal infarct   HPI: He reports feeling well until the acute onset of right low lateral abdomen pain on the morning of 08/29/2013. The pain subsided after a few minutes and returned several hours later. The pain was constant and severe. [facility] and a CT revealed a right renal infarct.  He reports feeling completely well prior to 08/29/2013. He has no previous history of venous or arterial thromboembolic disease. He exercises by running 3 miles 3 days per week.  No family history of venous or arterial thromboembolic disease.     Past Medical History: None   Past Surgical History  Procedure Laterality Date  . Hernia repair    :  Current facility-administered medications:0.9 %  sodium chloride infusion, , Intravenous, Continuous, Elease EtienneAnand D Hongalgi, MD, Last Rate: 125 mL/hr at 08/31/13 1209;  acetaminophen (TYLENOL) suppository 650 mg, 650 mg, Rectal, Q6H PRN, Eduard ClosArshad N Kakrakandy, MD;  acetaminophen (TYLENOL) tablet 650 mg, 650 mg, Oral, Q6H PRN, Eduard ClosArshad N Kakrakandy, MD, 650 mg at 08/31/13 0608;  aspirin EC tablet 325 mg, 325 mg, Oral, Daily, Elease EtienneAnand D Hongalgi, MD enoxaparin (LOVENOX) injection 40 mg, 40 mg, Subcutaneous, Daily, Eduard ClosArshad N Kakrakandy, MD, 40 mg at 08/31/13 1018;  hydrALAZINE (APRESOLINE) injection 10 mg, 10 mg, Intravenous, Q4H PRN, Eduard ClosArshad N Kakrakandy, MD, 10 mg at 08/31/13 1140;  morphine 2 MG/ML injection 1 mg, 1 mg, Intravenous, Q3H PRN, Eduard ClosArshad N Kakrakandy, MD, 1 mg at 08/30/13 0939;  ondansetron (ZOFRAN) injection 4 mg, 4 mg, Intravenous, Q6H PRN, Eduard ClosArshad N Kakrakandy, MD ondansetron [facility](ZOFRAN) tablet 4 mg, 4 mg, Oral, Q6H PRN, Eduard ClosArshad N Kakrakandy, MD;  sodium chloride 0.9 % injection 3 mL, 3 mL, Intravenous, Q12H, Eduard ClosArshad N Kakrakandy, MD, 3 mL at 08/30/13 2204:  . aspirin EC  325 mg Oral Daily  . enoxaparin (LOVENOX) injection  40 mg  Subcutaneous Daily  . sodium chloride  3 mL Intravenous Q12H  : Outpatient medications: Over-the-counter natural arthritis medication   No Known Allergies:  Family History  Problem Relation Age of Onset  . CAD Neg Hx   :  History   Social History  . Marital Status: Married    Spouse Name: N/A    Number of Children: N/A  . Years of Education: N/A   Occupational History  .  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 Smoking status: Never Smoker   . Smokeless tobacco: Not on file  . Alcohol Use: No  . Drug Use: No  . Sexual Activity: Not on file          Social History Narrative  .  [place]. No illicit drug use.   :  Review of Systems:  Positives include: Acute onset right low lateral abdomen pain 08/29/2013, chronic arthritis pain in his knee  A complete ROS was otherwise negative.   Physical Exam:  Blood pressure 165/97, pulse 69, temperature 100.1 F (37.8 C), temperature source Oral, resp. rate 17, height 6\' 6"  (1.981 m), weight 242 lb 8.1 oz (110 kg), SpO2 99.00%.  HEENT: Oropharynx without visible mass, neck without mass Lungs: Decreased breath sounds with end inspiratory coarse rhonchi at the extreme posterior base bilaterally, no respiratory distress Cardiac: Regular rate and rhythm, no murmur Abdomen: Mild tenderness in the right lateral low  abdomen near the pelvic brim, no hepatosplenomegaly, no mass GU: Testes without mass  Vascular: No leg edema, the femoral and dorsal pedis pulses are intact Lymph nodes: No cervical, supraclavicular, axillary, or inguinal nodes Neurologic: Alert and oriented, the motor exam appears intact in the upper and lower extremities Skin: No bed is without hemorrhages Musculoskeletal: **  LABS:   Recent Labs  08/29/13 1730 08/30/13 0650  WBC 10.9* 10.8*  HGB 14.8 14.0  HCT 44.2 41.5  PLT 200 204     Recent Labs  08/29/13 1730 08/30/13 0650   NA 137 137  K 3.8 3.8  CL 97 101  CO2 23 23  GLUCOSE 111* 98  BUN 14 14  CREATININE 1.13 1.20  CALCIUM 9.5 9.0      RADIOLOGY:  Ct Abdomen Pelvis W Contrast  08/30/2013   CLINICAL DATA:  Right lower quadrant pain  EXAM: CT ABDOMEN AND PELVIS WITH CONTRAST  TECHNIQUE: Multidetector CT imaging of the abdomen and pelvis was performed using the standard protocol following bolus administration of intravenous contrast.  CONTRAST:  100mL OMNIPAQUE IOHEXOL 300 MG/ML  SOLN  COMPARISON:  No comparisons  FINDINGS: The lung bases are clear.  The liver demonstrates no focal abnormality. There is no intrahepatic or extrahepatic biliary ductal dilatation. The gallbladder is normal. The spleen demonstrates no focal abnormality.There is a segment of the anterior right kidney demonstrating lack of enhancement which is well demarcated from the normally enhancing remainder of the right kidney with the overall appearance most concerning for right renal infarct. There is a 6 mm hypodensity in the upper pole of the right kidney which is too small to characterize. There is a hypodense, fluid attenuating 2.3 cm left renal mass most consistent with a cyst. The adrenal glands and pancreas are normal. The bladder is unremarkable.  The stomach, duodenum, small intestine, and large intestine demonstrate no gross abnormality. Majority of the contrast is within the stomach. There is a normal caliber appendix in the right lower quadrant without periappendiceal inflammatory changes. There is no pneumoperitoneum, pneumatosis, or portal venous gas. There is no abdominal or pelvic free fluid. There is no lymphadenopathy.  The abdominal aorta is normal in caliber.  There are no lytic or sclerotic osseous lesions. 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  IMPRESSION: 1. 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   Electronically Signed    By: Elige KoHetal  Patel   On: 08/30/2013 01:07    Assessment and Plan:   1. Right renal infarction 2. Pain secondary to #1 3. Low-grade fever 4. Hypertension 5. Elevated LDH  Nicholas Pace is a healthy-appearing 53 year old with no significant medical history. He presents with an acute right renal infarction. I suspect the low-grade fever, hypertension, and elevated LDH are related to the renal infarct.  No apparent etiology for the renal infarction. He is in sinus rhythm. No evidence of infection such as endocarditis. No symptoms to suggest a malignancy. He has no family history of venous or arterial thromboembolic disease.  Recommendations: 1. Investigate for arterial vascular abnormality and source of atheroemboli per the vascular surgery service 2. Followup hypercoagulation panel 3. if no etiology for the renal infarction is found I think it is reasonable to recommend 3-6 months of anticoagulation therapy followed by aspirin  Please call hematology as needed 09/01/2013. Hematology will check on him 09/02/2013.  Nicholas ArtistGary B Kiyoshi Schaab, MD 08/31/2013, 12:54 PM

## 2013-08-31 NOTE — Progress Notes (Signed)
Bilateral renal arterial and venous duplex completed.  No evidence of significant renal artery stenosis.  The renal veins appear patent.

## 2013-08-31 NOTE — Progress Notes (Signed)
PROGRESS NOTE    Nicholas Pace WUJ:[id number] DOB: 03/14/1961 DOA: 08/29/2013 PCP: No primary provider on file.  HPI/Brief narrative 53 y.o. male with no significant PMH presents to the ER with complaints of right flank pain and CT abdomen pelvis done showed right sided renal infarct.   Assessment/Plan:  1. Right renal infarct: Unclear etiology. Echo shows normal LVEF. Sinus rhythm on telemetry. Renal arterial and venous Dopplers negative. Followup hypercoagulable panel. Vascular surgery consulted-discussed with Dr. Myra Gianotti who recommended CT angiogram of chest abdomen and pelvis to rule out thrombus, IV hydration and start aspirin 325 mg daily. Low-grade fever and elevated blood pressure may be secondary to this. Hematology input appreciated-if no etiology for renal infarction found than recommend 3-6 months of anticoagulation therapy followed by aspirin. CT angiogram results as below-await vascular surgery input. 2. Elevated BP/? HTN: prn IV Hydralazine 3. Fever: Probably secondary to problem #1. Blood cultures x2: Negative to date.   Code Status: Full Family Communication: None at bedside. Disposition Plan: Home when medically stable   Consultants:  Hematology  Vascular surgery  Procedures:  None  Antibiotics:  None   Subjective: No further abdominal pain.  Objective: Filed Vitals:   08/30/13 2121 08/31/13 0556 08/31/13 1100 08/31/13 1300  BP: 166/80 146/96 165/97 139/81  Pulse: 77 78 69 81  Temp: 100.1 F (37.8 C) 100.4 F (38 C) 100.1 F (37.8 C) 100.6 F (38.1 C)  TempSrc: Oral Rectal Oral Oral  Resp: 17 18 17 18   Height:      Weight:      SpO2: 98% 96% 99% 98%    Intake/Output Summary (Last 24 hours) at 08/31/13 1524 Last data filed at 08/31/13 1300  Gross per 24 hour  Intake   1300 ml  Output   2200 ml  Net   -900 ml   Filed Weights   08/29/13 1727 08/30/13 0347  Weight: 109.77 kg (242 lb) 110 kg (242 lb 8.1 oz)     Exam:  General exam:  Pleasant young male lying comfortably in bed Respiratory system: Clear. No increased work of breathing. Cardiovascular system: S1 & S2 heard, RRR. No JVD, murmurs, gallops, clicks or pedal edema. Tele: SR Gastrointestinal system: Abdomen is nondistended, soft and nontender. Normal bowel sounds heard. Central nervous system: Alert and oriented. No focal neurological deficits. Extremities: Symmetric 5 x 5 power.   Data Reviewed: Basic Metabolic Panel:  Recent Labs Lab 08/29/13 1730 08/30/13 0650  NA 137 137  K 3.8 3.8  CL 97 101  CO2 23 23  GLUCOSE 111* 98  BUN 14 14  CREATININE 1.13 1.20  CALCIUM 9.5 9.0   Liver Function Tests:  Recent Labs Lab 08/29/13 1730 08/30/13 0650  AST 24 34  ALT 19 26  ALKPHOS 55 50  BILITOT 0.3 0.4  PROT 7.7 6.9  ALBUMIN 4.1 3.5   No results found for this basename: LIPASE, AMYLASE,  in the last 168 hours No results found for this basename: AMMONIA,  in the last 168 hours CBC:  Recent Labs Lab 08/29/13 1730 08/30/13 0650  WBC 10.9* 10.8*  NEUTROABS 9.4* 7.1  HGB 14.8 14.0  HCT 44.2 41.5  MCV 81.5 81.4  PLT 200 204   Cardiac Enzymes: No results found for this basename: CKTOTAL, CKMB, CKMBINDEX, TROPONINI,  in the last 168 hours BNP (last 3 results) No results found for this basename: PROBNP,  in the last 8760 hours CBG: No results found for this basename: GLUCAP,  in the last  168 hours  Recent Results (from the past 240 hour(s))  CULTURE, BLOOD (ROUTINE X 2)     Status: None   Collection Time    08/30/13  6:30 AM      Result Value Ref Range Status   Specimen Description BLOOD RIGHT ANTECUBITAL   Final   Special Requests BOTTLES DRAWN AEROBIC AND ANAEROBIC 5CC   Final   Culture  Setup Time     Final   Value: 08/30/2013 13:00     Performed at [facility]   Culture     Final   Value:        BLOOD CULTURE RECEIVED NO GROWTH TO DATE CULTURE WILL BE HELD FOR 5 DAYS BEFORE ISSUING A FINAL NEGATIVE REPORT     Performed  at [facility]   Report Status PENDING   Incomplete  CULTURE, BLOOD (ROUTINE X 2)     Status: None   Collection Time    08/30/13  6:45 AM      Result Value Ref Range Status   Specimen Description BLOOD RIGHT HAND   Final   Special Requests BOTTLES DRAWN AEROBIC AND ANAEROBIC 5CC   Final   Culture  Setup Time     Final   Value: 08/30/2013 13:00     Performed at [facility]   Culture     Final   Value:        BLOOD CULTURE RECEIVED NO GROWTH TO DATE CULTURE WILL BE HELD FOR 5 DAYS BEFORE ISSUING A FINAL NEGATIVE REPORT     Performed at [facility]   Report Status PENDING   Incomplete        Studies: Ct Abdomen Pelvis W Contrast  08/30/2013   CLINICAL DATA:  Right lower quadrant pain  EXAM: CT ABDOMEN AND PELVIS WITH CONTRAST  TECHNIQUE: Multidetector CT imaging of the abdomen and pelvis was performed using the standard protocol following bolus administration of intravenous contrast.  CONTRAST:  OMNIPAQUE IOHEXOL 300 MG/ML  SOLN  COMPARISON:  No comparisons  FINDINGS: The lung bases are clear.  The liver demonstrates no focal abnormality. There is no intrahepatic or extrahepatic biliary ductal dilatation. The gallbladder is normal. The spleen demonstrates no focal abnormality.There is a segment of the anterior right kidney demonstrating lack of enhancement which is well demarcated from the normally enhancing remainder of the right kidney with the overall appearance most concerning for right renal infarct. There is a 6 mm hypodensity in the upper pole of the right kidney which is too small to characterize. There is a hypodense, fluid attenuating 2.3 cm left renal mass most consistent with a cyst. The adrenal glands and pancreas are normal. The bladder is unremarkable.  The stomach, duodenum, small intestine, and large intestine demonstrate no gross abnormality. Majority of the contrast is within the stomach. There is a normal caliber appendix in the right lower  quadrant without periappendiceal inflammatory changes. There is no pneumoperitoneum, pneumatosis, or portal venous gas. There is no abdominal or pelvic free fluid. There is no lymphadenopathy.  The abdominal aorta is normal in caliber.  There are no lytic or sclerotic osseous lesions. 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  IMPRESSION: 1. 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   Electronically Signed   By: Elige Ko   On: 08/30/2013 01:07   Ct Angio Chest Aortic Dissect W &/or W/o  08/31/2013  CLINICAL DATA:  Right renal infarct. Evaluate for acute aortic abnormality.  EXAM: CT ANGIOGRAPHY CHEST, ABDOMEN AND PELVIS  TECHNIQUE: Multidetector CT imaging through the chest, abdomen and pelvis was performed using the standard protocol during bolus administration of intravenous contrast. Multiplanar reconstructed images and MIPs were obtained and reviewed to evaluate the vascular anatomy.  CONTRAST:  75mL OMNIPAQUE IOHEXOL 350 MG/ML SOLN  COMPARISON:  None.  FINDINGS: CTA CHEST FINDINGS  Mediastinum: Heart size is mildly enlarged. There is no significant pericardial fluid, thickening or pericardial calcification. No acute abnormality of the thoracic aorta or the great vessels of the mediastinum. Specifically, no evidence of aortic aneurysm or dissection. No pathologically enlarged mediastinal or hilar lymph nodes. Esophagus is unremarkable in appearance.  Lungs/Pleura: Minimal dependent subsegmental atelectasis in the lower lobes of the lungs bilaterally. No acute consolidative airspace disease. No pleural effusions. No suspicious appearing pulmonary nodules or masses.  Musculoskeletal: There are no aggressive appearing lytic or blastic lesions noted in the visualized portions of the skeleton.  Review of the MIP images confirms the above findings.  CTA ABDOMEN AND PELVIS FINDINGS  Abdomen/Pelvis: Again noted  is an area of lack of enhancement in the anterior aspect of the interpolar and lower pole region of the right kidney, compatible with an infarct. Single renal arteries are noted bilaterally, and right renal artery is widely patent without evidence of hemodynamically significant stenosis. There is a very subtle mural irregularity of a branch extending to the area of infarct best demonstrated on image 306 of series 502, which is of uncertain etiology, but could represent some adherent mural thrombus or a very short-segment dissection. Small amount of right-sided perinephric stranding.  Celiac axis, superior mesenteric artery and inferior mesenteric artery, as well as are major branches are all widely patent, as is the single left renal artery and its branches. Pulsation artifact affects the proximal external iliac arteries bilaterally, but the pelvic arteries are otherwise normal in appearance.  The appearance of the liver, gallbladder, pancreas, spleen and bilateral adrenal glands is unremarkable in appearance. Trace volume of free fluid in the low anatomic pelvis, presumably reactive to the right renal process. No larger volume of ascites, no pneumoperitoneum, no pathologic distention of small bowel. No lymphadenopathy identified within the abdomen or pelvis. Normal appendix.  Musculoskeletal: There are no aggressive appearing lytic or blastic lesions noted in the visualized portions of the skeleton.  Review of the MIP images confirms the above findings.  IMPRESSION: 1. Right renal infarct redemonstrated. The only potential abnormality related to this identified on today's study is a subtle mural irregularity in a renal artery branch to the infarcted portion of the kidney, which may represent some adherent mural thrombus, or a very short-segment dissection. 2. No acute abnormality of the thoracic or abdominal aorta. 3. Additional incidental findings, as above.   Electronically Signed   By: Trudie Reedaniel  Entrikin M.D.    On: 08/31/2013 13:59   Ct Angio Abd/pel W/ And/or W/o  08/31/2013   CLINICAL DATA:  Right renal infarct. Evaluate for acute aortic abnormality.  EXAM: CT ANGIOGRAPHY CHEST, ABDOMEN AND PELVIS  TECHNIQUE: Multidetector CT imaging through the chest, abdomen and pelvis was performed using the standard protocol during bolus administration of intravenous contrast. Multiplanar reconstructed images and MIPs were obtained and reviewed to evaluate the vascular anatomy.  CONTRAST:  75mL OMNIPAQUE IOHEXOL 350 MG/ML SOLN  COMPARISON:  None.  FINDINGS: CTA CHEST FINDINGS  Mediastinum: Heart size is mildly enlarged. There is no significant pericardial fluid,  thickening or pericardial calcification. No acute abnormality of the thoracic aorta or the great vessels of the mediastinum. Specifically, no evidence of aortic aneurysm or dissection. No pathologically enlarged mediastinal or hilar lymph nodes. Esophagus is unremarkable in appearance.  Lungs/Pleura: Minimal dependent subsegmental atelectasis in the lower lobes of the lungs bilaterally. No acute consolidative airspace disease. No pleural effusions. No suspicious appearing pulmonary nodules or masses.  Musculoskeletal: There are no aggressive appearing lytic or blastic lesions noted in the visualized portions of the skeleton.  Review of the MIP images confirms the above findings.  CTA ABDOMEN AND PELVIS FINDINGS  Abdomen/Pelvis: Again noted is an area of lack of enhancement in the anterior aspect of the interpolar and lower pole region of the right kidney, compatible with an infarct. Single renal arteries are noted bilaterally, and right renal artery is widely patent without evidence of hemodynamically significant stenosis. There is a very subtle mural irregularity of a branch extending to the area of infarct best demonstrated on image 306 of series 502, which is of uncertain etiology, but could represent some adherent mural thrombus or a very short-segment dissection.  Small amount of right-sided perinephric stranding.  Celiac axis, superior mesenteric artery and inferior mesenteric artery, as well as are major branches are all widely patent, as is the single left renal artery and its branches. Pulsation artifact affects the proximal external iliac arteries bilaterally, but the pelvic arteries are otherwise normal in appearance.  The appearance of the liver, gallbladder, pancreas, spleen and bilateral adrenal glands is unremarkable in appearance. Trace volume of free fluid in the low anatomic pelvis, presumably reactive to the right renal process. No larger volume of ascites, no pneumoperitoneum, no pathologic distention of small bowel. No lymphadenopathy identified within the abdomen or pelvis. Normal appendix.  Musculoskeletal: There are no aggressive appearing lytic or blastic lesions noted in the visualized portions of the skeleton.  Review of the MIP images confirms the above findings.  IMPRESSION: 1. Right renal infarct redemonstrated. The only potential abnormality related to this identified on today's study is a subtle mural irregularity in a renal artery branch to the infarcted portion of the kidney, which may represent some adherent mural thrombus, or a very short-segment dissection. 2. No acute abnormality of the thoracic or abdominal aorta. 3. Additional incidental findings, as above.   Electronically Signed   By: Trudie Reedaniel  Entrikin M.D.   On: 08/31/2013 13:59        Scheduled Meds: . aspirin EC  325 mg Oral Daily  . enoxaparin (LOVENOX) injection  40 mg Subcutaneous Daily  . sodium chloride  3 mL Intravenous Q12H   Continuous Infusions: . sodium chloride 125 mL/hr at 08/31/13 1209    Principal Problem:   Renal infarct Active Problems:   Elevated blood pressure    Time spent: 25 mins    Elease EtienneAnand D Zedekiah Hinderman, MD, FACP, [facility]. [facility] Pager [phone]  If 7PM-7AM, please contact night-coverage [email or web address] Password [email or web address] 08/31/2013, 3:24  PM    LOS: 2 days

## 2013-09-01 DIAGNOSIS — N2889 Other specified disorders of kidney and ureter: Secondary | ICD-10-CM

## 2013-09-01 LAB — BASIC METABOLIC PANEL
BUN: 13 mg/dL (ref 6–23)
CHLORIDE: 104 meq/L (ref 96–112)
CO2: 22 mEq/L (ref 19–32)
CREATININE: 1.21 mg/dL (ref 0.50–1.35)
Calcium: 8.9 mg/dL (ref 8.4–10.5)
GFR calc Af Amer: 78 mL/min — ABNORMAL LOW (ref >90)
GFR calc non Af Amer: 67 mL/min — ABNORMAL LOW (ref >90)
Glucose, Bld: 99 mg/dL (ref 70–99)
POTASSIUM: 3.8 meq/L (ref 3.7–5.3)
SODIUM: 139 meq/L (ref 137–147)

## 2013-09-01 LAB — LIPID PANEL
CHOL/HDL RATIO: 3.1 ratio
Cholesterol: 232 mg/dL — ABNORMAL HIGH (ref 0–200)
HDL: 76 mg/dL (ref >39)
LDL CALC: 139 mg/dL — AB (ref 0–99)
TRIGLYCERIDES: 85 mg/dL (ref <150)
VLDL: 17 mg/dL (ref 0–40)

## 2013-09-01 LAB — CBC
HEMATOCRIT: 41.2 % (ref 39.0–52.0)
Hemoglobin: 13.7 g/dL (ref 13.0–17.0)
MCH: 27.3 pg (ref 26.0–34.0)
MCHC: 33.3 g/dL (ref 30.0–36.0)
MCV: 82.2 fL (ref 78.0–100.0)
Platelets: 189 10*3/uL (ref 150–400)
RBC: 5.01 MIL/uL (ref 4.22–5.81)
RDW: 14.7 % (ref 11.5–15.5)
WBC: 11.6 10*3/uL — AB (ref 4.0–10.5)

## 2013-09-01 MED ORDER — ASPIRIN 81 MG PO TBEC: 81.0000 mg | DELAYED_RELEASE_TABLET | Freq: Every day | ORAL | Status: AC

## 2013-09-01 MED ORDER — RIVAROXABAN 15 MG PO TABS: 15.0000 mg | ORAL_TABLET | Freq: Two times a day (BID) | ORAL | Status: DC

## 2013-09-01 NOTE — Discharge Summary (Addendum)
Physician Discharge Summary  Nicholas Pace CHY:[id number] DOB: Jan 16, 1961 DOA: 08/29/2013  PCP: Foye Spurling, MD  Admit date: 08/29/2013 Discharge date: 09/01/2013  Time spent: Less than 30 minutes  Recommendations for Outpatient Follow-up:  1. Dr. Jeanann Lewandowsky, PCP in 2 days for post hospital discharge followup. Please followup results of hypercoagulable panel and final blood culture results, that was sent from the hospital. 2. Dr. Harold Barban, vascular surgery, in 3 months with repeat CT angiogram of chest abdomen and pelvis.  Discharge Diagnoses:  Principal Problem:   Renal infarct Active Problems:   Elevated blood pressure   Discharge Condition: Improved & Stable  Diet recommendation: Heart healthy diet  Filed Weights   08/29/13 1727 08/30/13 0347  Weight: 109.77 kg (242 lb) 110 kg (242 lb 8.1 oz)    History of present illness:  53 y.o. male with no significant PMH presents to the ER with complaints of right flank pain and CT abdomen pelvis done showed right sided renal infarct.  Hospital Course:   Patient underwent extensive evaluation. He was admitted to telemetry and remained in sinus rhythm. 2-D echo showed normal LVEF. Renal artery and venous Dopplers were negative. Hypercoagulable panel has been sent and most of the results are pending. Vascular surgery and hematology were consulted. Based on vascular surgery recommendations, patient underwent CT angiogram of chest, abdomen and pelvis to look for etiology for right renal infarct. Dr. Trula Slade has reviewed the patient's CT angiogram and indicates that there is no obvious source of infarct. There is a focal abnormality in the right renal artery that could represent mural thrombus versus short segment dissection. He recommends 3-6 months of anticoagulation with Xarelto (discussed with him) and aspirin 81 mg daily, control blood pressure <140/90 and treat hyperlipidemia. Case management assisted patient with discount coupons for  same. Patient has been given prescription for first 21 days dosing and will need prescription for subsequent Xarelto. Dr. Trula Slade will followup as outpatient in 3 months with repeat CT angiogram of chest abdomen and pelvis. Patient had low-grade fevers and mildly elevated blood pressures which may be secondary to his renal infarct. He has been advised to use Tylenol when necessary for pain and fever. He is to followup with his PCP in the next 2 days when his blood pressures can be checked again and consider if he needs to be on antihypertensives. His blood pressures but occasionally elevated in the hospital. Dr. Benay Spice, hematology also saw patient and had similar recommendations. During outpatient followup, may also consider medications for dyslipidemia.  Consultations:  Hematology  Vascular surgery  Procedures:  None    Discharge Exam:  Complaints:  Patient denied complaints. No pain or fever. 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 No increased work of breathing.  Cardiovascular system: S1 & S2 heard, RRR. No JVD, murmurs, gallops, clicks or pedal edema. Tele: SR  Gastrointestinal system: Abdomen is nondistended, soft and nontender. Normal bowel sounds heard.  Central  nervous system: Alert and oriented. No focal neurological deficits.  Extremities: Symmetric 5 x 5 power.   Discharge Instructions      Discharge Orders   Future Orders Complete By Expires   Call MD for:  severe uncontrolled pain  As directed    Call MD for:  temperature >100.4  As  directed    Diet - low sodium heart healthy  As directed    Increase activity slowly  As directed        Medication List         aspirin 81 MG EC tablet  Take 1 tablet (81 mg total) by mouth daily.     MULTI-VITAMIN PO  Take 2 tablets by mouth 2 (two) times daily. "Alleviate"     Rivaroxaban 15 MG Tabs tablet  Commonly known as:  XARELTO  Take 1 tablet (15 mg total) by mouth 2 (two) times daily with a meal.     Vitamin C 250 MG Chew  Chew 3 tablets by mouth daily.       Follow-up Information   Follow up with Foye Spurling, MD. Schedule an appointment as soon as possible for a visit in 2 days. ([facility] discharge follow up. )    Specialty:  Internal Medicine   Contact information:   [address] [place] [address] [phone]       Follow up with Ramond Craver, [place], MD. Schedule an appointment as soon as possible for a visit in 3 months.   Specialty:  Vascular Surgery   Contact information:   [address] [place] [address] [phone]        The results of significant diagnostics from this hospitalization (including imaging, microbiology, ancillary and laboratory) are listed below for reference.    Significant Diagnostic Studies: Ct Abdomen Pelvis W Contrast  08/30/2013   CLINICAL DATA:  Right lower quadrant pain  EXAM: CT ABDOMEN AND PELVIS WITH CONTRAST  TECHNIQUE: Multidetector CT imaging of the abdomen and pelvis was performed using the standard protocol following bolus administration of intravenous contrast.  CONTRAST:  153m OMNIPAQUE IOHEXOL 300 MG/ML  SOLN  COMPARISON:  No comparisons  FINDINGS: The lung bases are clear.  The liver demonstrates no focal abnormality. There is no intrahepatic or extrahepatic biliary ductal dilatation. The gallbladder is normal. The spleen demonstrates no focal abnormality.There is a segment of the anterior right kidney demonstrating lack of enhancement which is well demarcated from the normally  enhancing remainder of the right kidney with the overall appearance most concerning for right renal infarct. There is a 6 mm hypodensity in the upper pole of the right kidney which is too small to characterize. There is a hypodense, fluid attenuating 2.3 cm left renal mass most consistent with a cyst. The adrenal glands and pancreas are normal. The bladder is unremarkable.  The stomach, duodenum, small intestine, and large intestine demonstrate no gross abnormality. Majority of the contrast is within the stomach. There is a normal caliber appendix in the right lower quadrant without periappendiceal inflammatory changes. There is no pneumoperitoneum, pneumatosis, or portal venous gas. There is no abdominal or pelvic free fluid. There is no lymphadenopathy.  The abdominal aorta is normal in caliber.  There are no lytic or sclerotic osseous lesions. 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  IMPRESSION: 1. 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   Electronically Signed   By: HElbert Ewings  Patel   On: 08/30/2013 01:07   Ct Angio Chest Aortic Dissect W &/or W/o  08/31/2013   CLINICAL DATA:  Right renal infarct. Evaluate for acute aortic abnormality.  EXAM: CT ANGIOGRAPHY CHEST, ABDOMEN AND PELVIS  TECHNIQUE: Multidetector CT imaging through the chest, abdomen and pelvis was performed using the standard protocol during bolus administration of intravenous contrast. Multiplanar reconstructed images and MIPs were obtained and reviewed to evaluate the vascular anatomy.  CONTRAST:  69m OMNIPAQUE IOHEXOL 350 MG/ML SOLN  COMPARISON:  None.  FINDINGS: CTA CHEST FINDINGS  Mediastinum: Heart size is mildly enlarged. There is no significant pericardial fluid, thickening or pericardial calcification. No acute abnormality of the thoracic aorta or the great vessels of the mediastinum. Specifically, no evidence of aortic aneurysm or  dissection. No pathologically enlarged mediastinal or hilar lymph nodes. Esophagus is unremarkable in appearance.  Lungs/Pleura: Minimal dependent subsegmental atelectasis in the lower lobes of the lungs bilaterally. No acute consolidative airspace disease. No pleural effusions. No suspicious appearing pulmonary nodules or masses.  Musculoskeletal: There are no aggressive appearing lytic or blastic lesions noted in the visualized portions of the skeleton.  Review of the MIP images confirms the above findings.  CTA ABDOMEN AND PELVIS FINDINGS  Abdomen/Pelvis: Again noted is an area of lack of enhancement in the anterior aspect of the interpolar and lower pole region of the right kidney, compatible with an infarct. Single renal arteries are noted bilaterally, and right renal artery is widely patent without evidence of hemodynamically significant stenosis. There is a very subtle mural irregularity of a branch extending to the area of infarct best demonstrated on image 306 of series 502, which is of uncertain etiology, but could represent some adherent mural thrombus or a very short-segment dissection. Small amount of right-sided perinephric stranding.  Celiac axis, superior mesenteric artery and inferior mesenteric artery, as well as are major branches are all widely patent, as is the single left renal artery and its branches. Pulsation artifact affects the proximal external iliac arteries bilaterally, but the pelvic arteries are otherwise normal in appearance.  The appearance of the liver, gallbladder, pancreas, spleen and bilateral adrenal glands is unremarkable in appearance. Trace volume of free fluid in the low anatomic pelvis, presumably reactive to the right renal process. No larger volume of ascites, no pneumoperitoneum, no pathologic distention of small bowel. No lymphadenopathy identified within the abdomen or pelvis. Normal appendix.  Musculoskeletal: There are no aggressive appearing lytic or blastic  lesions noted in the visualized portions of the skeleton.  Review of the MIP images confirms the above findings.  IMPRESSION: 1. Right renal infarct redemonstrated. The only potential abnormality related to this identified on today's study is a subtle mural irregularity in a renal artery branch to the infarcted portion of the kidney, which may represent some adherent mural thrombus, or a very short-segment dissection. 2. No acute abnormality of the thoracic or abdominal aorta. 3. Additional incidental findings, as above.   Electronically Signed   By: DVinnie LangtonM.D.   On: 08/31/2013 13:59   Ct Angio Abd/pel W/ And/or W/o  08/31/2013   CLINICAL DATA:  Right renal infarct. Evaluate for acute aortic abnormality.  EXAM: CT ANGIOGRAPHY CHEST, ABDOMEN AND PELVIS  TECHNIQUE: Multidetector CT imaging through the chest, abdomen and pelvis was performed using the standard protocol during bolus administration of intravenous contrast. Multiplanar reconstructed images and MIPs were obtained and reviewed to evaluate the vascular anatomy.  CONTRAST:  767mOMNIPAQUE IOHEXOL 350 MG/ML SOLN  COMPARISON:  None.  FINDINGS: CTA CHEST FINDINGS  Mediastinum: Heart size is mildly enlarged. There is no significant pericardial fluid, thickening or pericardial calcification. No acute abnormality of the thoracic aorta or the great vessels of the mediastinum. Specifically, no evidence of aortic aneurysm or dissection. No pathologically enlarged mediastinal or hilar lymph nodes. Esophagus is unremarkable in appearance.  Lungs/Pleura: Minimal dependent subsegmental atelectasis in the lower lobes of the lungs bilaterally. No acute consolidative airspace disease. No pleural effusions. No suspicious appearing pulmonary nodules or masses.  Musculoskeletal: There are no aggressive appearing lytic or blastic lesions noted in the visualized portions of the skeleton.  Review of the MIP images confirms the above findings.  CTA ABDOMEN AND PELVIS  FINDINGS  Abdomen/Pelvis: Again noted is an area of lack of enhancement in the anterior aspect of the interpolar and lower pole region of the right kidney, compatible with an infarct. Single renal arteries are noted bilaterally, and right renal artery is widely patent without evidence of hemodynamically significant stenosis. There is a very subtle mural irregularity of a branch extending to the area of infarct best demonstrated on image 306 of series 502, which is of uncertain etiology, but could represent some adherent mural thrombus or a very short-segment dissection. Small amount of right-sided perinephric stranding.  Celiac axis, superior mesenteric artery and inferior mesenteric artery, as well as are major branches are all widely patent, as is the single left renal artery and its branches. Pulsation artifact affects the proximal external iliac arteries bilaterally, but the pelvic arteries are otherwise normal in appearance.  The appearance of the liver, gallbladder, pancreas, spleen and bilateral adrenal glands is unremarkable in appearance. Trace volume of free fluid in the low anatomic pelvis, presumably reactive to the right renal process. No larger volume of ascites, no pneumoperitoneum, no pathologic distention of small bowel. No lymphadenopathy identified within the abdomen or pelvis. Normal appendix.  Musculoskeletal: There are no aggressive appearing lytic or blastic lesions noted in the visualized portions of the skeleton.  Review of the MIP images confirms the above findings.  IMPRESSION: 1. Right renal infarct redemonstrated. The only potential abnormality related to this identified on today's study is a subtle mural irregularity in a renal artery branch to the infarcted portion of the kidney, which may represent some adherent mural thrombus, or a very short-segment dissection. 2. No acute abnormality of the thoracic or abdominal aorta. 3. Additional incidental findings, as above.   Electronically  Signed   By: Vinnie Langton M.D.   On: 08/31/2013 13:59    Microbiology: Recent Results (from the past 240 hour(s))  CULTURE, BLOOD (ROUTINE X 2)     Status: None   Collection Time    08/30/13  6:30 AM      Result Value Ref Range Status   Specimen Description BLOOD RIGHT ANTECUBITAL   Final   Special Requests BOTTLES DRAWN AEROBIC AND ANAEROBIC 5CC   Final   Culture  Setup Time     Final   Value: 08/30/2013 13:00     Performed at [facility]   Culture     Final   Value:        BLOOD CULTURE RECEIVED NO GROWTH TO DATE CULTURE WILL BE HELD FOR 5 DAYS BEFORE ISSUING A FINAL NEGATIVE REPORT     Performed at [facility]   Report Status PENDING   Incomplete  CULTURE, BLOOD (ROUTINE X 2)     Status: None   Collection Time    08/30/13  6:45 AM      Result Value Ref Range Status   Specimen Description BLOOD RIGHT HAND   Final   Special Requests BOTTLES DRAWN AEROBIC AND ANAEROBIC 5CC   Final   Culture  Setup Time     Final   Value: 08/30/2013 13:00     Performed at [facility]   Culture     Final   Value:        BLOOD CULTURE RECEIVED NO GROWTH TO DATE CULTURE WILL BE HELD FOR 5 DAYS BEFORE ISSUING A FINAL NEGATIVE REPORT     Performed at [facility]   Report Status PENDING   Incomplete     Labs: Basic Metabolic Panel:  Recent Labs Lab 08/29/13 1730 08/30/13 0650 09/01/13 0715  NA 137 137 139  K 3.8 3.8 3.8  CL 97 101 104  CO2 23 23 22   GLUCOSE 111* 98 99  BUN 14 14 13   CREATININE 1.13 1.20 1.21  CALCIUM 9.5 9.0 8.9   Liver Function Tests:  Recent Labs Lab 08/29/13 1730 08/30/13 0650  AST 24 34  ALT 19 26  ALKPHOS 55 50  BILITOT 0.3 0.4  PROT 7.7 6.9  ALBUMIN 4.1 3.5   No results found for this basename: LIPASE, AMYLASE,  in the last 168 hours No results found for this basename: AMMONIA,  in the last 168 hours CBC:  Recent Labs Lab 08/29/13 1730 08/30/13 0650 09/01/13 0715  WBC 10.9* 10.8* 11.6*  NEUTROABS 9.4* 7.1   --   HGB 14.8 14.0 13.7  HCT 44.2 41.5 41.2  MCV 81.5 81.4 82.2  PLT 200 204 189   Cardiac Enzymes: No results found for this basename: CKTOTAL, CKMB, CKMBINDEX, TROPONINI,  in the last 168 hours BNP: BNP (last 3 results) No results found for this basename: PROBNP,  in the last 8760 hours CBG: No results found for this basename: GLUCAP,  in the last 168 hours  Additional labs: 1. Fasting lipids: Cholesterol 232, triglycerides 86, HDL 76, LDL 139 and VLDL 17 2. LDH: 368 3. Homocysteine 9.2 4. ESR: 2 5. Antithrombin 3 functional: 96 6. 2-D echo 08/30/13: Study Conclusions  Left ventricle: The cavity size was normal. Wall thickness was increased in a pattern of mild LVH. Systolic function was normal. The estimated ejection fraction was in the range of 55% to 60%.     Signed:  Modena Jansky, MD, FACP, [facility] Pager [phone]  If 7PM-7AM, please contact night-coverage [email or web address] Password [facility] 09/01/2013, 9:23 AM

## 2013-09-01 NOTE — Progress Notes (Signed)
Pt discharged home with family, self care. Instructions given along with follow up appointment. Care management medication assistance provided . Transferred out in stable condition; alert, oriented and conversant.

## 2013-09-01 NOTE — Progress Notes (Signed)
CARE MANAGEMENT NOTE 09/01/2013  Patient:  Nicholas Pace, Nicholas Pace   Account Number:  [id number]  Date Initiated:  08/30/2013  Documentation initiated by:  Lizabeth Leyden  Subjective/Objective Assessment:   admitted with c/o right flank pain, found to have right renal infarct     Action/Plan:   Anticipated DC Date:  09/02/2013   Anticipated DC Plan:  [place]  CM consult  Medication Assistance      Choice offered to / List presented to:             Status of service:  Completed, signed off Medicare Important Message given?   (If response is "NO", the following Medicare IM given date fields will be blank) Date Medicare IM given:   Date Additional Medicare IM given:    Discharge Disposition:  HOME/SELF CARE  Per UR Regulation:  Reviewed for med. necessity/level of care/duration of stay  If discussed at [place] of Stay Meetings, dates discussed:    Comments:  09/01/13 09:00 CM met with pt and spouse in room and gave pt Xarelto free 30 day trial card.  CM also gave pt co-pay discount card.  Pt verbalized understanding the 30 day free trial card needed to  be activated via internet prior to going to pharmacy with card and prescription.  Pt also verbalized understanding the trial card will give him enough time to get to his follow up visit, have the medications preauthorized by the follow up MD office, activate the co pay card for refills.  Pt states he has a PCP, Jeanann Lewandowsky, MD, though not listed on facesheet. No other CM needs were communicated.  Mariane Masters, BSN, CM [phone].

## 2013-09-02 ENCOUNTER — Other Ambulatory Visit: Payer: Self-pay

## 2013-09-02 DIAGNOSIS — N280 Ischemia and infarction of kidney: Secondary | ICD-10-CM

## 2013-09-02 LAB — BETA-2-GLYCOPROTEIN I ABS, IGG/M/A
BETA 2 GLYCO I IGG: 7 G Units (ref <20)
BETA-2-GLYCOPROTEIN I IGA: 4 A Units (ref <20)
Beta-2-Glycoprotein I IgM: 4 M Units (ref <20)

## 2013-09-02 LAB — PROTEIN S, TOTAL: Protein S Ag, Total: 70 % (ref 60–150)

## 2013-09-02 LAB — CARDIOLIPIN ANTIBODIES, IGG, IGM, IGA
Anticardiolipin IgA: 4 APL U/mL — ABNORMAL LOW (ref <22)
Anticardiolipin IgG: 8 GPL U/mL — ABNORMAL LOW (ref <23)
Anticardiolipin IgM: 2 MPL U/mL — ABNORMAL LOW (ref <11)

## 2013-09-02 LAB — PROTHROMBIN GENE MUTATION

## 2013-09-02 LAB — FACTOR 5 LEIDEN

## 2013-09-02 LAB — PROTEIN C, TOTAL: PROTEIN C, TOTAL: 93 % (ref 72–160)

## 2013-09-03 LAB — LUPUS ANTICOAGULANT PANEL
DRVVT: 35.3 secs (ref <42.9)
Lupus Anticoagulant: NOT DETECTED
PTT Lupus Anticoagulant: 33.6 secs (ref 28.0–43.0)

## 2013-09-03 LAB — PROTEIN S ACTIVITY: PROTEIN S ACTIVITY: 74 % (ref 69–129)

## 2013-09-03 LAB — PROTEIN C ACTIVITY: Protein C Activity: 145 % — ABNORMAL HIGH (ref 75–133)

## 2013-09-04 ENCOUNTER — Telehealth: Payer: Self-pay | Admitting: Surgery

## 2013-09-04 NOTE — Telephone Encounter (Addendum)
Message copied by Fredrich BirksMILLIKAN, DANA P on Wed Sep 04, 2013 11:56 AM ------      Message from: Lamar BlinksPULLINS, CAROL S      Created: Mon Sep 02, 2013  9:08 AM      Regarding: Joyce GrossKay log; F/U 3 mos. with CTA                    ----- Message -----         From: Erenest Blankarol Sue Pullins, RN         Sent: 09/02/2013   9:06 AM           To:       Subject: Joyce GrossKay log                                                              ----- Message -----         From: Nada LibmanVance W Brabham, MD         Sent: 09/01/2013   8:04 AM           To: [facility] Charge Pool            09/01/2013:  Level 4 consult            F/u 3 months with CTA c/a/P ------  09/04/13: lm for pt to cb for appt info, mailed letter, [facility], dpm

## 2013-09-05 LAB — CULTURE, BLOOD (ROUTINE X 2)
CULTURE: NO GROWTH
Culture: NO GROWTH

## 2013-09-24 ENCOUNTER — Encounter: Payer: BC Managed Care – PPO | Admitting: Oncology

## 2013-12-13 ENCOUNTER — Encounter: Payer: Self-pay | Admitting: Surgery

## 2013-12-16 ENCOUNTER — Ambulatory Visit: Payer: BC Managed Care – PPO | Admitting: Surgery

## 2013-12-16 ENCOUNTER — Other Ambulatory Visit: Payer: BC Managed Care – PPO

## 2014-01-27 ENCOUNTER — Other Ambulatory Visit: Payer: BC Managed Care – PPO

## 2014-01-27 ENCOUNTER — Ambulatory Visit: Payer: BC Managed Care – PPO | Admitting: Surgery

## 2014-04-04 ENCOUNTER — Encounter: Payer: Self-pay | Admitting: Surgery

## 2014-04-07 ENCOUNTER — Ambulatory Visit: Payer: Self-pay | Admitting: Surgery

## 2015-03-30 ENCOUNTER — Other Ambulatory Visit: Payer: Self-pay | Admitting: *Deleted

## 2015-03-30 DIAGNOSIS — R1030 Lower abdominal pain, unspecified: Secondary | ICD-10-CM

## 2015-03-30 DIAGNOSIS — I159 Secondary hypertension, unspecified: Secondary | ICD-10-CM

## 2015-03-31 ENCOUNTER — Telehealth: Payer: Self-pay | Admitting: Surgery

## 2015-03-31 NOTE — Telephone Encounter (Signed)
-----   Message from Sharee PimpleMarilyn K McChesney, RN sent at 03/30/2015  9:27 AM EST ----- Regarding: FW: Order Per [facility], have pt come in for Renal U/S here and see him for followup on this. No CTA at this time. Thanks  ----- Message -----    From: Sharee PimpleMarilyn K McChesney, RN    Sent: 03/27/2015   3:47 PM      To: Fredrich Birksana P [place] Subject: RE: Order                                      I'm going to ask [facility] about this Monday. Pt was only a hospital consult [phone] and never seen again. We may just do some kind of duplex here.   ----- Message -----    From: Fredrich Birksana P [place]    Sent: 03/27/2015   3:14 PM      To: [facility] Clinical Pool Subject: Order                                          Nicholas Pace called today to reschedule his missed appts from last November. He was supposed to have a CTA chest/abd/pelvis at that time. Could someone please enter the order for the CTA? He is a patient of Dr Coralee PesaBrabhams.  Thank you! Annabelle Harmanana

## 2015-03-31 NOTE — Telephone Encounter (Signed)
Spoke with pt to arrange, dpm

## 2015-04-21 ENCOUNTER — Inpatient Hospital Stay (HOSPITAL_COMMUNITY): Admission: RE | Admit: 2015-04-21 | Payer: Self-pay | Source: Ambulatory Visit

## 2015-04-27 ENCOUNTER — Ambulatory Visit: Payer: Self-pay | Admitting: Surgery

## 2015-05-04 ENCOUNTER — Ambulatory Visit (HOSPITAL_COMMUNITY)
Admission: RE | Admit: 2015-05-04 | Discharge: 2015-05-04 | Disposition: A | Discharge status: Home / Self Care | Payer: BLUE CROSS/BLUE SHIELD | Source: Ambulatory Visit | Attending: Surgery | Admitting: Surgery

## 2015-05-04 DIAGNOSIS — R1030 Lower abdominal pain, unspecified: Secondary | ICD-10-CM | POA: Insufficient documentation

## 2015-05-04 DIAGNOSIS — I159 Secondary hypertension, unspecified: Secondary | ICD-10-CM | POA: Diagnosis not present

## 2015-05-29 ENCOUNTER — Encounter: Payer: Self-pay | Admitting: Surgery

## 2015-06-05 ENCOUNTER — Encounter: Payer: Self-pay | Admitting: Surgery

## 2015-06-08 ENCOUNTER — Ambulatory Visit: Payer: Self-pay | Admitting: Surgery

## 2015-06-15 ENCOUNTER — Encounter: Payer: Self-pay | Admitting: Surgery

## 2015-06-15 ENCOUNTER — Ambulatory Visit (INDEPENDENT_AMBULATORY_CARE_PROVIDER_SITE_OTHER): Payer: BLUE CROSS/BLUE SHIELD | Admitting: Surgery

## 2015-06-15 VITALS — BP 130/78 | HR 69 | Temp 98.1°F | Resp 16 | Ht 77.5 in | Wt 231.0 lb

## 2015-06-15 DIAGNOSIS — N280 Ischemia and infarction of kidney: Secondary | ICD-10-CM | POA: Diagnosis not present

## 2015-06-15 NOTE — Progress Notes (Signed)
Patient name: Nicholas Pace MRN: [id number] DOB: Mar 29, 1961 Sex: male     Chief Complaint  Patient presents with  . New Evaluation    Pt here for Renal U/S results of 05-04-15    HISTORY OF PRESENT ILLNESS: This is a 55 year old gentleman who is back for follow-up.  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  He underwent a full workup which was negative.  He was discharged home on aspirin.  He missed his follow-up and is now resurfaced.  He has no complaints.  History reviewed. No pertinent past medical history.  Past Surgical History  Procedure Laterality Date  . Hernia repair      Social History   Social History  . Marital Status: Married    Spouse Name: N/A  . Number of Children: N/A  . Years of Education: N/A   Occupational History  . Not on file.   Social History Main Topics  . Smoking status: Never Smoker   . Smokeless tobacco: Never Used  . Alcohol Use: No  . Drug Use: No  . Sexual Activity: Not on file   Other Topics Concern  . Not on file   Social History Narrative    Family History  Problem Relation Age of Onset  . CAD Neg Hx     Allergies as of 06/15/2015  . (No Known Allergies)    Current Outpatient Prescriptions on File Prior to Visit  Medication Sig Dispense Refill  . Ascorbic Acid (VITAMIN C) 250 MG CHEW Chew 1 tablet by mouth as needed.     Marland Kitchen aspirin EC 81 MG EC tablet Take 1 tablet (81 mg total) by mouth daily. 30 tablet 0  . Multiple Vitamin (MULTI-VITAMIN PO) Take 2 tablets by mouth 2 (two) times daily. Reported on 06/15/2015    . Rivaroxaban (XARELTO) 15 MG TABS tablet Take 1 tablet (15 mg total) by mouth 2 (two) times daily with a meal. (Patient not taking: Reported on 06/15/2015) 41 tablet 0   No current facility-administered medications on file prior to visit.     REVIEW OF SYSTEMS: Cardiovascular: No chest pain, chest pressure, palpitations, orthopnea, or  dyspnea on exertion. No claudication or rest pain,  No history of DVT or phlebitis. Pulmonary: No productive cough, asthma or wheezing. Neurologic: No weakness, paresthesias, aphasia, or amaurosis. No dizziness. Hematologic: No bleeding problems or clotting disorders. Musculoskeletal: No joint pain or joint swelling. Gastrointestinal: No blood in stool or hematemesis Genitourinary: No dysuria or hematuria. Psychiatric:: No history of major depression. Integumentary: No rashes or ulcers. Constitutional: No fever or chills.  PHYSICAL EXAMINATION:   Vital signs are  Filed Vitals:   06/15/15 1533  BP: 130/78  Pulse: 69  Temp: 98.1 F (36.7 C)  TempSrc: Oral  Resp: 16  Height: 6' 5.5" (1.969 m)  Weight: 231 lb (104.781 kg)  SpO2: 96%   Body mass index is 27.03 kg/(m^2). General: The patient appears their stated age. HEENT:  No gross abnormalities Pulmonary:  Non labored breathing Abdomen: Soft and non-tender Musculoskeletal: There are no major deformities. Neurologic: No focal weakness or paresthesias are detected, Skin: There are no ulcer or rashes noted. Psychiatric: The patient has normal affect. Cardiovascular: There is a regular rate and rhythm without significant murmur appreciated.  Palpable bilateral radial, bilateral pedal pulses no carotid bruits   Diagnostic Studies The patient hadn't duplex ultrasound today which shows no evidence of right  renal artery stenosis.  Left renal artery was not well visualized the kidney was normal.  There was decreased or absent blood flow in the lower pole of the right kidney which was small, corresponding to the area of decreased vascularization  Assessment: Right renal infarct Plan: The patient continues to do very well.  I have encouraged him to stay well hydrated, continue taking an aspirin, and to avoid nephrotoxic medications including minimizing the use of ibuprofen.  There was no clear etiology for his infarct.  He will continue  on single agent antiplatelet therapy.  I'm going to repeat his CT scan in 1 year.  Jorge Ny, M.D. [facility] Office: [phone] Pager:  [phone]

## 2015-09-25 IMAGING — CT CT ABD-PELV W/ CM
2 of 5 series · 16 of 46 positions shown, 18 images · IV contrast (APPLIED)
Comparison: No comparisons

CLINICAL DATA: Right lower quadrant pain

EXAM:
CT ABDOMEN AND PELVIS WITH CONTRAST
TECHNIQUE: Multidetector CT imaging of the abdomen and pelvis was performed
using the standard protocol following bolus administration of
intravenous contrast.
CONTRAST:  100mL OMNIPAQUE IOHEXOL 300 MG/ML  SOLN

[Series 2: abd/ pelvis 5.0 i30f 1 · axial · 0.75mm/px · z∈[-231,+249]mm · 13 of 108 slices shown, 15 images]
[im 6/108  soft-tissue]
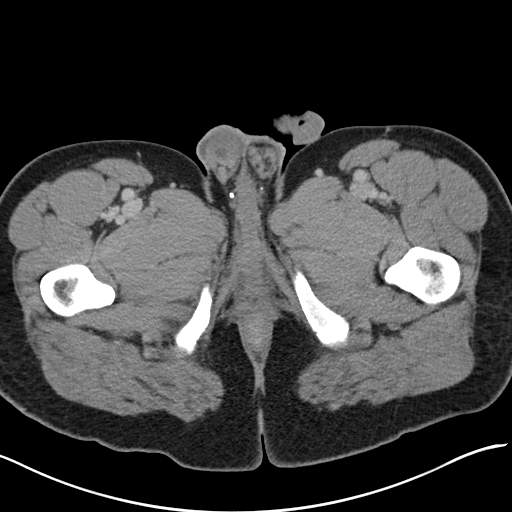
[im 6/108  bone]
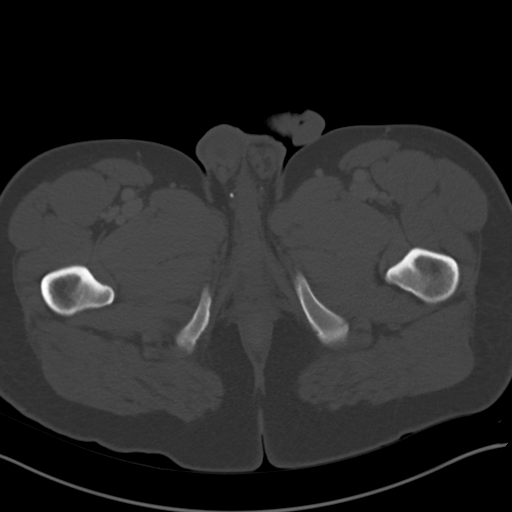
[im 17/108  soft-tissue]
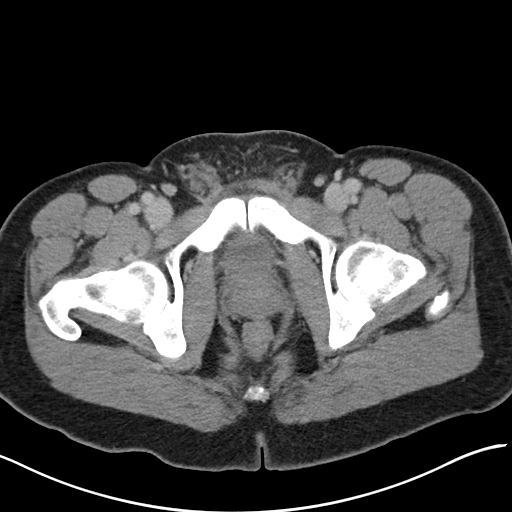
[im 23/108  soft-tissue]
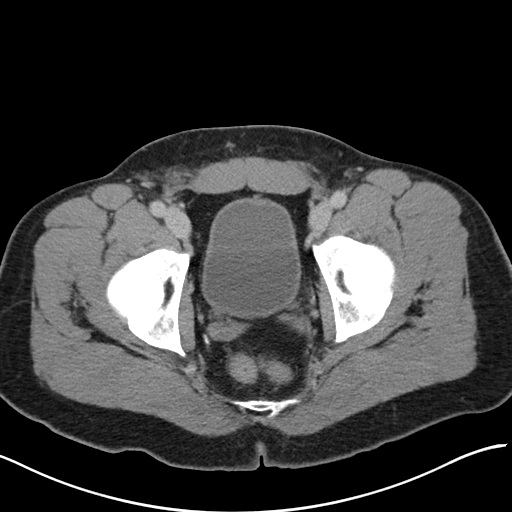
[im 29/108  soft-tissue]
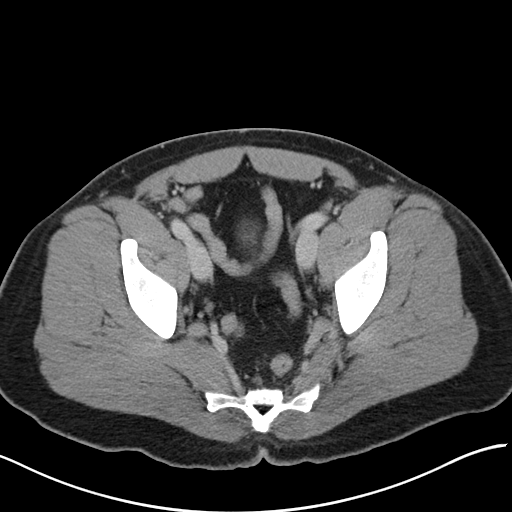
[im 40/108  soft-tissue]
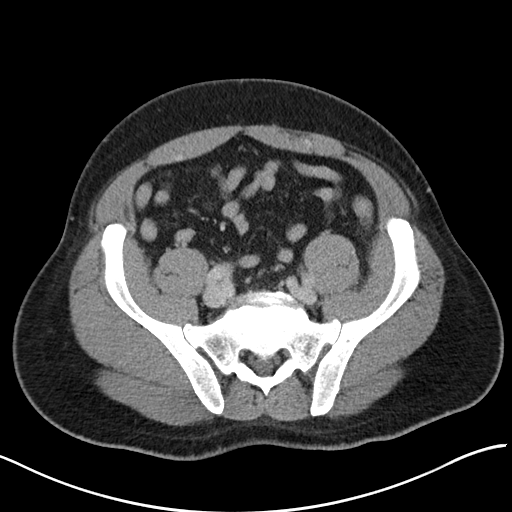
[im 46/108  soft-tissue]
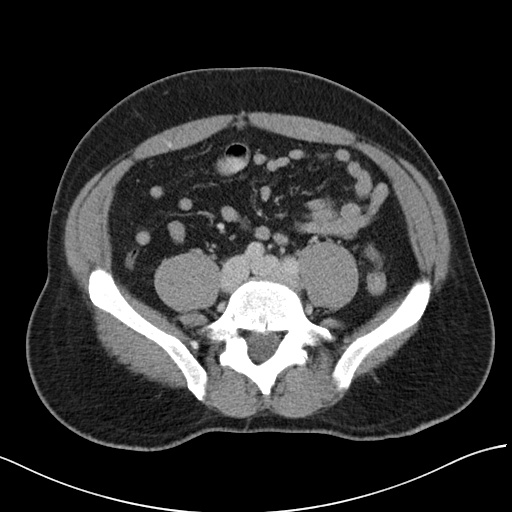
[im 57/108  soft-tissue]
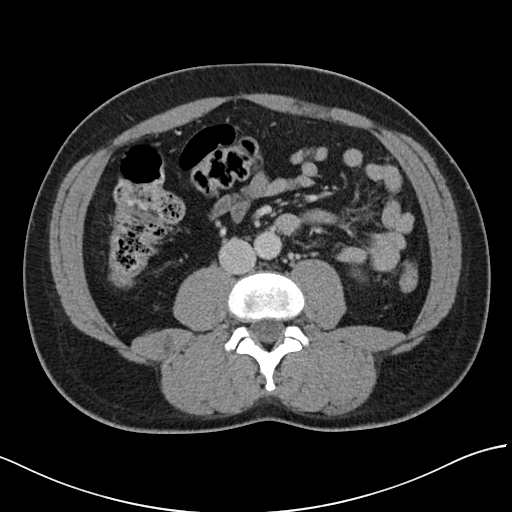
[im 62/108  soft-tissue]
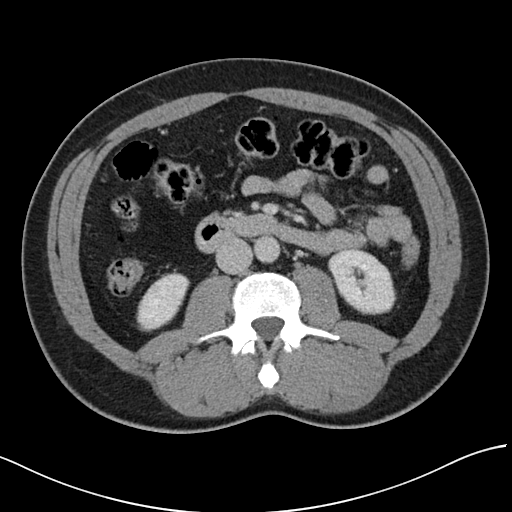
[im 68/108  soft-tissue]
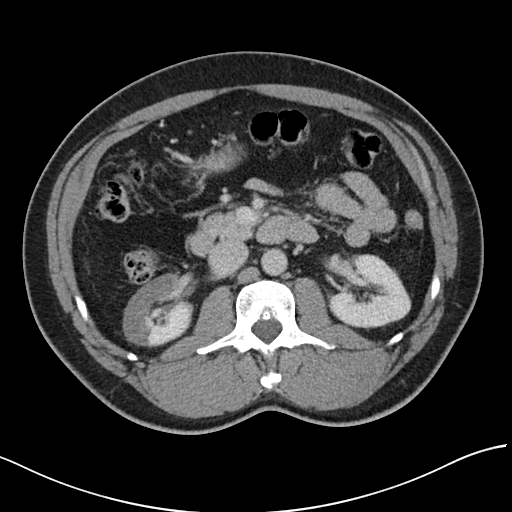
[im 68/108  bone]
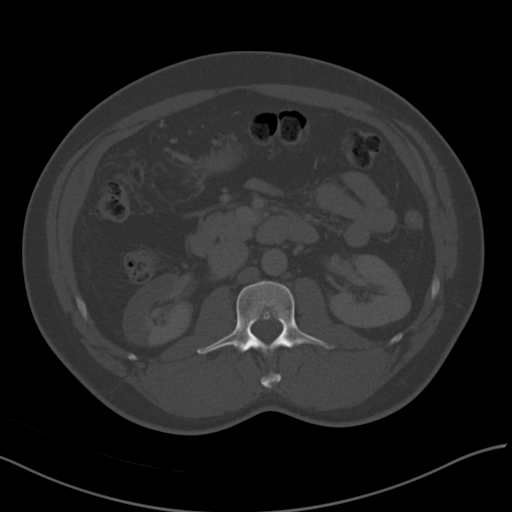
[im 79/108  soft-tissue]
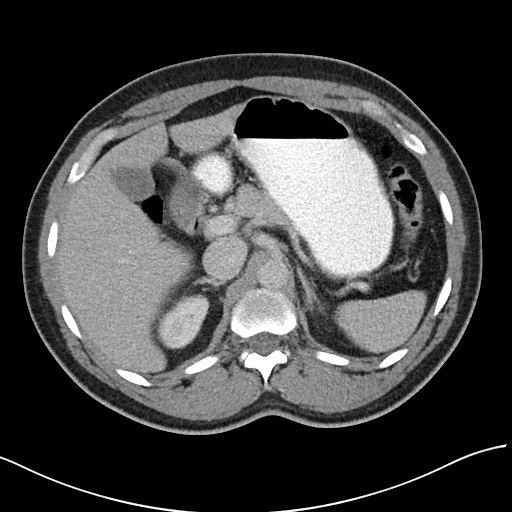
[im 85/108  soft-tissue]
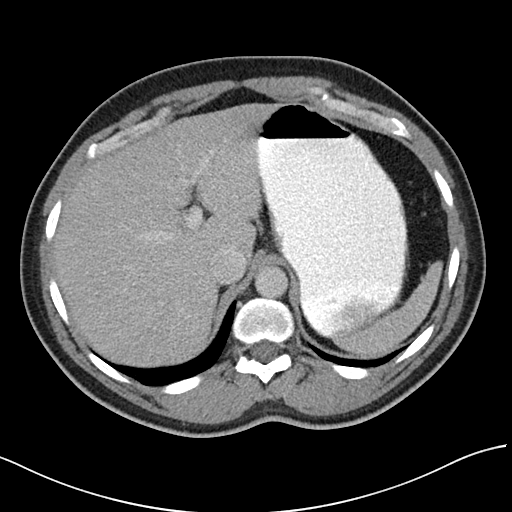
[im 91/108  soft-tissue]
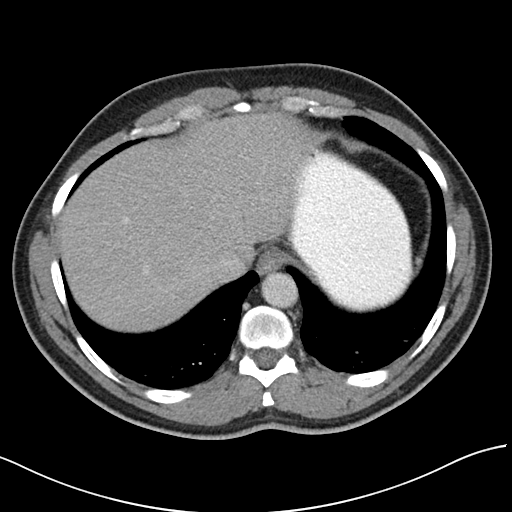
[im 102/108  soft-tissue]
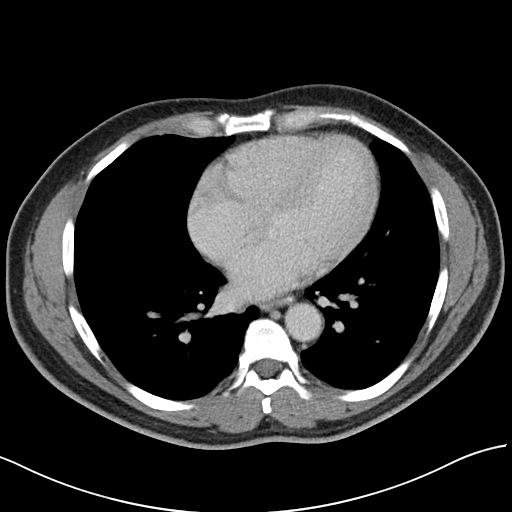

[Series 5: coronal soft tissue · coronal · 0.87mm/px · 3 of 86 slices shown]
[im 29/86  soft-tissue]
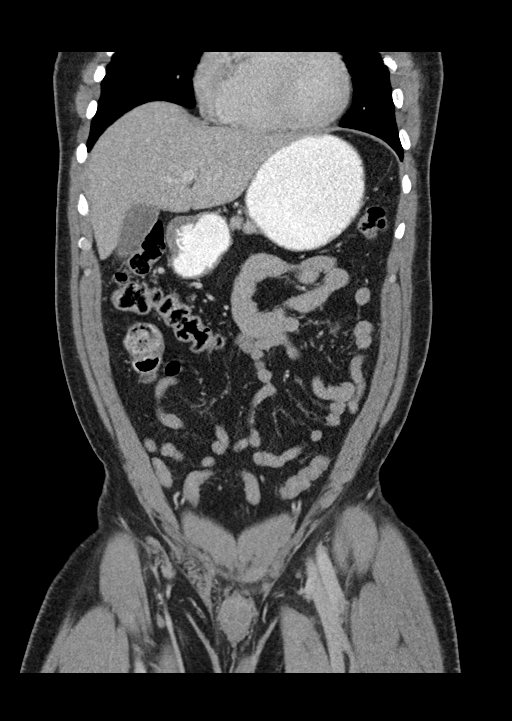
[im 38/86  soft-tissue]
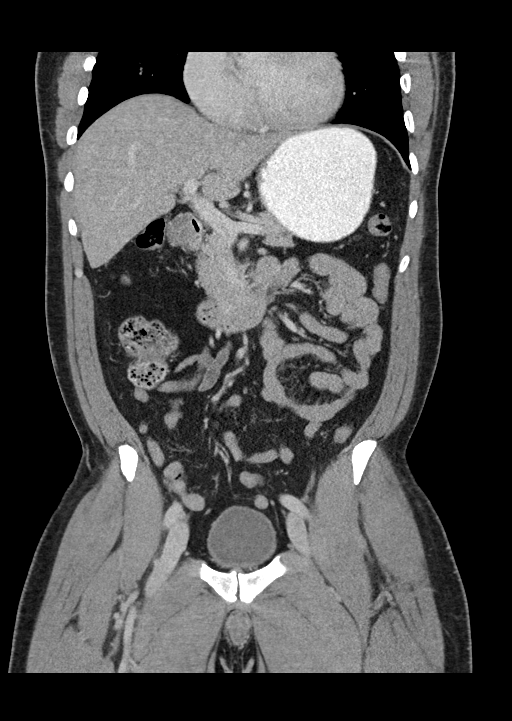
[im 48/86  soft-tissue]
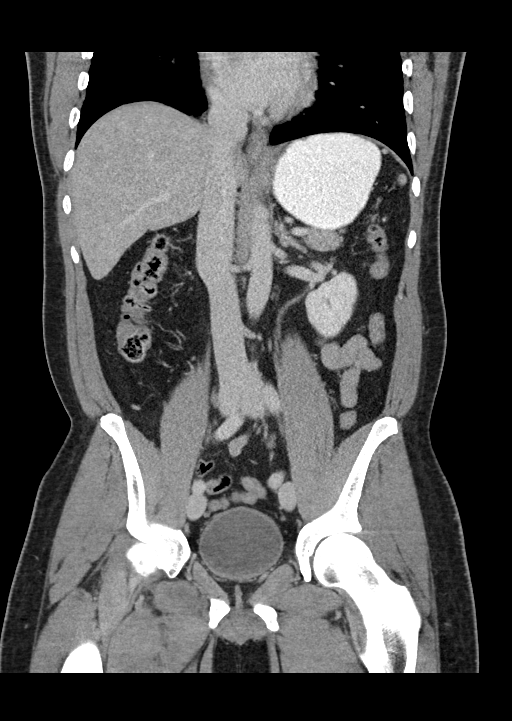

[16 of 46 positions shown; findings below may reference images not displayed]

FINDINGS: The lung bases are clear.

The liver demonstrates no focal abnormality. There is no
intrahepatic or extrahepatic biliary ductal dilatation. The
gallbladder is normal. The spleen demonstrates no focal
abnormality.There is a segment of the anterior right kidney
demonstrating lack of enhancement which is well demarcated from the
normally enhancing remainder of the right kidney with the overall
appearance most concerning for right renal infarct. There is a 6 mm
hypodensity in the upper pole of the right kidney which is too small
to characterize. There is a hypodense, fluid attenuating 2.3 cm left
renal mass most consistent with a cyst. The adrenal glands and
pancreas are normal. The bladder is unremarkable.

The stomach, duodenum, small intestine, and large intestine
demonstrate no gross abnormality. Majority of the contrast is within
the stomach. There is a normal caliber appendix in the right lower
quadrant without periappendiceal inflammatory changes. There is no
pneumoperitoneum, pneumatosis, or portal venous gas. There is no
abdominal or pelvic free fluid. There is no lymphadenopathy.

The abdominal aorta is normal in caliber.

There are no lytic or sclerotic osseous lesions. There is
degenerative disc disease at L5-S1. There is a unilateral right pars
interarticularis defect.
IMPRESSION: 1. Right renal infarct involving the anterior interpolar aspect of
the right kidney.

## 2018-09-25 ENCOUNTER — Ambulatory Visit (INDEPENDENT_AMBULATORY_CARE_PROVIDER_SITE_OTHER): Payer: BC Managed Care – PPO | Admitting: Cardiology

## 2018-09-25 ENCOUNTER — Other Ambulatory Visit: Payer: Self-pay

## 2018-09-25 ENCOUNTER — Encounter: Payer: Self-pay | Admitting: Cardiology

## 2018-09-25 VITALS — BP 132/90 | HR 67 | Temp 97.1°F | Ht 78.0 in | Wt 238.0 lb

## 2018-09-25 DIAGNOSIS — I10 Essential (primary) hypertension: Secondary | ICD-10-CM | POA: Diagnosis not present

## 2018-09-25 DIAGNOSIS — N280 Ischemia and infarction of kidney: Secondary | ICD-10-CM

## 2018-09-25 DIAGNOSIS — R9431 Abnormal electrocardiogram [ECG] [EKG]: Secondary | ICD-10-CM

## 2018-09-25 DIAGNOSIS — R0789 Other chest pain: Secondary | ICD-10-CM

## 2018-09-25 DIAGNOSIS — E782 Mixed hyperlipidemia: Secondary | ICD-10-CM

## 2018-09-25 MED ORDER — NITROGLYCERIN 0.4 MG SL SUBL: 0.4000 mg | SUBLINGUAL_TABLET | SUBLINGUAL | 1 refills | Status: AC | PRN

## 2018-09-25 NOTE — Progress Notes (Signed)
Primary Physician/Referring:  Gwenyth Bender, MD  Patient ID: Nicholas Pace, male    DOB: May 20, 1960, 58 y.o.   MRN: [id number]  Chief Complaint  Patient presents with  . New Patient (Initial Visit)  . Chest Pain  . Abnormal ECG    HPI: Nicholas Pace  is a 58 y.o. male  with history of right renal infarct in 2015 with negative workup by Dr. Myra Gianotti, hypertension, and hyperlipidemia referred on urgent basis by PCP, Dr. Willey Blade, for abnormal EKG and chest pain.  Patient has had intermittent chest discomfort in the center of his chest for the last 3 weeks.  Chest discomfort occurs approximately every 3 to 4 days.  Generally occurs at rest.  He has not noticed chest discomfort or shortness of breath with exertion.  Pain does not radiate or associate with shortness of breath.  He had started omeprazole to see if related to GERD; however, has not had any improvement with this.  He has not had any episodes of chest discomfort that awakened him from sleep.  Denies any leg edema, PND, orthopnea, or palpitations.  Blood pressure is generally well controlled.  Reports having labs performed earlier this year and states were normal.  No family history of heart disease. He does [employer] 3 days a week without any exertional activity.  No changes in exercise tolerance.  No previous history of tobacco use, alcohol use or illicit drug use.  Past Medical History:  Diagnosis Date  . Hyperlipidemia   . Hypertension     Past Surgical History:  Procedure Laterality Date  . HERNIA REPAIR      Social History   Socioeconomic History  . Marital status: Married    Spouse name: Not on file  . Number of children: 2  . Years of education: Not on file  . Highest education level: Not on file  Occupational History  . Not on file  Social Needs  . Financial resource strain: Not on file  . Food insecurity:    Worry: Not on file    Inability: Not on file  . Transportation needs:    Medical: Not on file   Non-medical: Not on file  Tobacco Use  . Smoking status: Never Smoker  . Smokeless tobacco: Never Used  Substance and Sexual Activity  . Alcohol use: No  . Drug use: No  . Sexual activity: Not on file  Lifestyle  . Physical activity:    Days per week: Not on file    Minutes per session: Not on file  . Stress: Not on file  Relationships  . Social connections:    Talks on phone: Not on file    Gets together: Not on file    Attends religious service: Not on file    Active member of club or organization: Not on file    Attends meetings of clubs or organizations: Not on file    Relationship status: Not on file  . Intimate partner violence:    Fear of current or ex partner: Not on file    Emotionally abused: Not on file    Physically abused: Not on file    Forced sexual activity: Not on file  Other Topics Concern  . Not on file  Social History Narrative  . Not on file    Current Outpatient Medications on File Prior to Visit  Medication Sig Dispense Refill  . amLODipine (NORVASC) 10 MG tablet Take 10 mg by mouth daily.    Marland Kitchen  Ascorbic Acid (VITAMIN C) 250 MG CHEW Chew 1 tablet by mouth as needed.     Marland Kitchen. aspirin EC 81 MG EC tablet Take 1 tablet (81 mg total) by mouth daily. 30 tablet 0  . atorvastatin (LIPITOR) 20 MG tablet Take 20 mg by mouth daily.    . irbesartan (AVAPRO) 150 MG tablet Take 150 mg by mouth daily.    . Multiple Vitamin (MULTI-VITAMIN PO) Take 1 tablet by mouth daily. Reported on 06/15/2015     No current facility-administered medications on file prior to visit.     Review of Systems  Constitution: Negative for decreased appetite, malaise/fatigue, weight gain and weight loss.  Eyes: Negative for visual disturbance.  Cardiovascular: Positive for chest pain. Negative for claudication, dyspnea on exertion, leg swelling, orthopnea, palpitations and syncope.  Respiratory: Negative for hemoptysis and wheezing.   Endocrine: Negative for cold intolerance and heat  intolerance.  Hematologic/Lymphatic: Does not bruise/bleed easily.  Skin: Negative for nail changes.  Musculoskeletal: Negative for muscle weakness and myalgias.  Gastrointestinal: Negative for abdominal pain, change in bowel habit, nausea and vomiting.  Neurological: Negative for difficulty with concentration, dizziness, focal weakness and headaches.  Psychiatric/Behavioral: Negative for altered mental status and suicidal ideas.  All other systems reviewed and are negative.     Objective  Blood pressure 132/90, pulse 67, temperature (!) 97.1 F (36.2 C), height 6\' 6"  (1.981 m), weight 238 lb (108 kg), SpO2 99 %. Body mass index is 27.5 kg/m.    Physical Exam  Constitutional: He is oriented to person, place, and time. Vital signs are normal. He appears well-developed and well-nourished.  HENT:  Head: Normocephalic and atraumatic.  Neck: Normal range of motion.  Cardiovascular: Normal rate, regular rhythm, normal heart sounds and intact distal pulses.  Pulmonary/Chest: Effort normal and breath sounds normal. No accessory muscle usage. No respiratory distress.  Abdominal: Soft. Bowel sounds are normal.  Musculoskeletal: Normal range of motion.  Neurological: He is alert and oriented to person, place, and time.  Skin: Skin is warm and dry.  Vitals reviewed.  Radiology:  CTA abd and pelvis 08/31/2013:  1. Right renal infarct redemonstrated. The only potential abnormality related to this identified on today's study is a subtle mural irregularity in a renal artery branch to the infarcted portion of the kidney, which may represent some adherent mural thrombus, or a very short-segment dissection. 2. No acute abnormality of the thoracic or abdominal aorta. 3. Additional incidental findings, as above.  Renal artery duplex 08/31/2013:  Bilateral: No evidence of renal artery stenosis noted. The renal veins appear patent. - Bilateral abnormal intrarenal resistive indices.  Laboratory  examination:    CMP Latest Ref Rng & Units 09/01/2013 08/30/2013 08/29/2013  Glucose 70 - 99 mg/dL 99 98 119(J111(H)  BUN 6 - 23 mg/dL 13 14 14   Creatinine 0.50 - 1.35 mg/dL 4.781.21 2.951.20 6.211.13  Sodium 137 - 147 mEq/L 139 137 137  Potassium 3.7 - 5.3 mEq/L 3.8 3.8 3.8  Chloride 96 - 112 mEq/L 104 101 97  CO2 19 - 32 mEq/L 22 23 23   Calcium 8.4 - 10.5 mg/dL 8.9 9.0 9.5  Total Protein 6.0 - 8.3 g/dL - 6.9 7.7  Total Bilirubin 0.3 - 1.2 mg/dL - 0.4 0.3  Alkaline Phos 39 - 117 U/L - 50 55  AST 0 - 37 U/L - 34 24  ALT 0 - 53 U/L - 26 19   CBC Latest Ref Rng & Units 09/01/2013 08/30/2013 08/29/2013  WBC 4.0 -  10.5 K/uL 11.6(H) 10.8(H) 10.9(H)  Hemoglobin 13.0 - 17.0 g/dL 13.0 86.5 78.4  Hematocrit 39.0 - 52.0 % 41.2 41.5 44.2  Platelets 150 - 400 K/uL 189 204 200   Lipid Panel     Component Value Date/Time   CHOL 232 (H) 09/01/2013 0715   TRIG 85 09/01/2013 0715   HDL 76 09/01/2013 0715   CHOLHDL 3.1 09/01/2013 0715   VLDL 17 09/01/2013 0715   LDLCALC 139 (H) 09/01/2013 0715   HEMOGLOBIN A1C No results found for: HGBA1C, MPG TSH No results for input(s): TSH in the last 8760 hours.  Cardiac Studies:     Assessment   Atypical chest pain - Plan: EKG 12-Lead, PCV MYOCARDIAL PERFUSION WO LEXISCAN, PCV ECHOCARDIOGRAM COMPLETE  Abnormal EKG - Plan: PCV MYOCARDIAL PERFUSION WO LEXISCAN, PCV ECHOCARDIOGRAM COMPLETE  Essential hypertension  Mixed hyperlipidemia  Renal infarct (HCC)  PCP EKG 09/25/2018: Normal sinus rhythm at 70 bpm, normal axis, cannot exclude inferior infarct old. Cannot exclude anterior infarct old. No evidence of ischemia. Abnormal EKG.  EKG 09/25/2018: Normal sinus rhythm at 61 bpm, normal axis, no evidence of ischemia.   Recommendations:   Patient with hypertension, hyperlipidemia, history of right renal infarct in 2015 with negative work-up per Dr. Myra Gianotti.  Patient has had intermittent episodes of chest discomfort for the last 3 days, that appear to be atypical  as he is able to do high intensity exercises 3 days a week without exertional chest discomfort or decreased exercise tolerance.  Had mild abnormalities on EKG in PCP office, but were not noted on EKG in our office.  Given his risk factors, I recommended further evaluation with exercise nuclear stress test.  I have given him prescription for nitroglycerin and advised how to use.  He is currently on aspirin and would recommend continuing the same.  He is borderline bradycardic; therefore, I have not started him on beta-blocker therapy, but if suspicion for angina could consider low-dose.  Continue with amlodipine and irbesartan for hypertension.  Blood pressure slightly elevated in our office, but was normal per patient and Dr. Diamantina [place] office earlier today.  We will continue to monitor.  He states hyperlipidemia is well controlled.  Continue with Lipitor.  In view of mild EKG abnormalities, will obtain echocardiogram for further evaluation to exclude any wall motion abnormalities.  Encouraged him to contact me sooner for any worsening problems.  I will see him back after stress test for further recommendations and reevaluation.   *I have discussed this case with Dr. Sherril Croon and he participated in formulating the plan.*   Toniann Fail, MSN, APRN, FNP-C [facility] Office: [phone] Fax: [phone]

## 2018-10-10 ENCOUNTER — Other Ambulatory Visit: Payer: BC Managed Care – PPO

## 2018-10-12 ENCOUNTER — Ambulatory Visit (INDEPENDENT_AMBULATORY_CARE_PROVIDER_SITE_OTHER): Payer: BC Managed Care – PPO

## 2018-10-12 ENCOUNTER — Other Ambulatory Visit: Payer: Self-pay

## 2018-10-12 DIAGNOSIS — R0789 Other chest pain: Secondary | ICD-10-CM | POA: Diagnosis not present

## 2018-10-12 DIAGNOSIS — R9431 Abnormal electrocardiogram [ECG] [EKG]: Secondary | ICD-10-CM

## 2018-10-16 NOTE — Progress Notes (Signed)
Pt aware of results and pending appts.//ah

## 2018-10-30 ENCOUNTER — Ambulatory Visit (INDEPENDENT_AMBULATORY_CARE_PROVIDER_SITE_OTHER): Payer: BC Managed Care – PPO

## 2018-10-30 ENCOUNTER — Other Ambulatory Visit: Payer: Self-pay

## 2018-10-30 DIAGNOSIS — R9431 Abnormal electrocardiogram [ECG] [EKG]: Secondary | ICD-10-CM | POA: Diagnosis not present

## 2018-10-30 DIAGNOSIS — R0789 Other chest pain: Secondary | ICD-10-CM

## 2018-11-05 ENCOUNTER — Other Ambulatory Visit: Payer: Self-pay

## 2018-11-05 ENCOUNTER — Encounter: Payer: Self-pay | Admitting: Cardiology

## 2018-11-05 ENCOUNTER — Ambulatory Visit: Payer: BC Managed Care – PPO | Admitting: Cardiology

## 2018-11-05 VITALS — BP 130/90 | Ht 78.0 in | Wt 232.0 lb

## 2018-11-05 DIAGNOSIS — I10 Essential (primary) hypertension: Secondary | ICD-10-CM | POA: Diagnosis not present

## 2018-11-05 DIAGNOSIS — R9431 Abnormal electrocardiogram [ECG] [EKG]: Secondary | ICD-10-CM | POA: Diagnosis not present

## 2018-11-05 MED ORDER — IRBESARTAN-HYDROCHLOROTHIAZIDE 300-12.5 MG PO TABS: 1.0000 | ORAL_TABLET | ORAL | 3 refills | Status: DC

## 2018-11-05 NOTE — Progress Notes (Signed)
Virtual Visit via Video Note: This visit type was conducted due to national recommendations for restrictions regarding the COVID-19 Pandemic (e.g. social distancing).  This format is felt to be most appropriate for this patient at this time.  All issues noted in this document were discussed and addressed.  No physical exam was performed (except for noted visual exam findings with Telehealth visits).  The patient has consented to conduct a Telehealth visit and understands insurance will be billed.   I connected with@, on 11/05/18 at  by a video enabled telemedicine application and verified that I am speaking with the correct person using two identifiers.   I discussed the limitations of evaluation and management by telemedicine and the availability of in person appointments. The patient expressed understanding and agreed to proceed.   I have discussed with patient regarding the safety during COVID Pandemic and steps and precautions to be taken including social distancing, frequent hand wash and use of detergent soap, gels with the patient. I asked the patient to avoid touching mouth, nose, eyes, ears with the hands. I encouraged regular walking around the neighborhood and exercise and regular diet, as long as social distancing can be maintained.  Primary Physician/Referring:  Gwenyth Benderean, Dezman L, MD  Patient ID: Nicholas Pace, male    DOB: 02/25/1961, 58 y.o.   MRN: [id number]  Chief Complaint  Patient presents with  . Chest Pain  . Results  . Follow-up    4week    HPI: Nicholas Betterric Jhaveri  is a 58 y.o. male  with history of right renal infarct in 2015 with negative workup by Dr. Myra GianottiBrabham, doing well since then, essential hypertension, and hyperlipidemia was seen by me about a month ago for abnormal EKG and chest pain, referred by Dr. Willey BladeEric Dean.  Due to exertional complement, he underwent nuclear stress test and also an echocardiogram and I am seeing him on a virtual visit today.  Patient states that fortunately  he has not had any further episodes of chest pain.  He is presently doing well and essentially remains asymptomatic. Past Medical History:  Diagnosis Date  . Hyperlipidemia   . Hypertension     Past Surgical History:  Procedure Laterality Date  . HERNIA REPAIR      Social History   Socioeconomic History  . Marital status: Married    Spouse name: Not on file  . Number of children: 2  . Years of education: Not on file  . Highest education level: Not on file  Occupational History  . Not on file  Social Needs  . Financial resource strain: Not on file  . Food insecurity    Worry: Not on file    Inability: Not on file  . Transportation needs    Medical: Not on file    Non-medical: Not on file  Tobacco Use  . Smoking status: Never Smoker  . Smokeless tobacco: Never Used  Substance and Sexual Activity  . Alcohol use: No  . Drug use: No  . Sexual activity: Not on file  Lifestyle  . Physical activity    Days per week: Not on file    Minutes per session: Not on file  . Stress: Not on file  Relationships  . Social Musicianconnections    Talks on phone: Not on file    Gets together: Not on file    Attends religious service: Not on file    Active member of club or organization: Not on file    Attends meetings  of clubs or organizations: Not on file    Relationship status: Not on file  . Intimate partner violence    Fear of current or ex partner: Not on file    Emotionally abused: Not on file    Physically abused: Not on file    Forced sexual activity: Not on file  Other Topics Concern  . Not on file  Social History Narrative  . Not on file    Current Outpatient Medications on File Prior to Visit  Medication Sig Dispense Refill  . amLODipine (NORVASC) 10 MG tablet Take 10 mg by mouth daily.    . Ascorbic Acid (VITAMIN C) 250 MG CHEW Chew 1 tablet by mouth as needed.     Marland Kitchen. aspirin EC 81 MG EC tablet Take 1 tablet (81 mg total) by mouth daily. 30 tablet 0  . atorvastatin  (LIPITOR) 20 MG tablet Take 20 mg by mouth daily.    . Multiple Vitamin (MULTI-VITAMIN PO) Take 1 tablet by mouth daily. Reported on 06/15/2015    . nitroGLYCERIN (NITROSTAT) 0.4 MG SL tablet Place 1 tablet (0.4 mg total) under the tongue every 5 (five) minutes as needed for chest pain. 25 tablet 1   No current facility-administered medications on file prior to visit.    Review of Systems  Constitution: Negative for decreased appetite, malaise/fatigue, weight gain and weight loss.  Eyes: Negative for visual disturbance.  Cardiovascular: Negative for chest pain, claudication, dyspnea on exertion, leg swelling, orthopnea, palpitations and syncope.  Respiratory: Negative for hemoptysis and wheezing.   Endocrine: Negative for cold intolerance and heat intolerance.  Hematologic/Lymphatic: Does not bruise/bleed easily.  Skin: Negative for nail changes.  Musculoskeletal: Negative for muscle weakness and myalgias.  Gastrointestinal: Negative for abdominal pain, change in bowel habit, nausea and vomiting.  Neurological: Negative for difficulty with concentration, dizziness, focal weakness and headaches.  Psychiatric/Behavioral: Negative for altered mental status and suicidal ideas.  All other systems reviewed and are negative.     Objective  Blood pressure 130/90, height 6\' 6"  (1.981 m), weight 232 lb (105.2 kg). Body mass index is 26.81 kg/m.   Physical exam not performed or limited due to virtual visit.  Patient appeared to be in no distress, Neck was supple, respiration was not labored.  Please see exam details from prior visit is as below.  Physical Exam  Constitutional: He is oriented to person, place, and time. Vital signs are normal. He appears well-developed and well-nourished.  HENT:  Head: Normocephalic and atraumatic.  Neck: Normal range of motion.  Cardiovascular: Normal rate, regular rhythm, normal heart sounds and intact distal pulses.  Pulmonary/Chest: Effort normal and breath  sounds normal. No accessory muscle usage. No respiratory distress.  Abdominal: Soft. Bowel sounds are normal.  Musculoskeletal: Normal range of motion.  Neurological: He is alert and oriented to person, place, and time.  Skin: Skin is warm and dry.  Vitals reviewed.  Radiology:  CTA abd and pelvis 08/31/2013:  1. Right renal infarct redemonstrated. The only potential abnormality related to this identified on today's study is a subtle mural irregularity in a renal artery branch to the infarcted portion of the kidney, which may represent some adherent mural thrombus, or a very short-segment dissection. 2. No acute abnormality of the thoracic or abdominal aorta. 3. Additional incidental findings, as above.  Renal artery duplex 08/31/2013:  Bilateral: No evidence of renal artery stenosis noted. The renal veins appear patent. - Bilateral abnormal intrarenal resistive indices.  Laboratory examination:  Cardiac Studies:   Echocardiogram 10/30/2018: Normal LV systolic function with EF 67%. Left ventricle cavity is normal in size. Mild concentric hypertrophy of the left ventricle. Normal global wall motion. Normal diastolic filling pattern. Left atrial cavity is mildly dilated. Mild (Grade I) mitral regurgitation. Normal right atrial pressure.   Exercise Sestamibi Stress Test 10/12/2018: Patient exercised on the Bruce protocol. They exercised for a total of 9 minutes and 13 seconds, achieving approximately 10.51 METs. Normal ECG stress. Normal BP response. Normal exercise tolerance. Normal myocardial perfusion. Stress LV EF: 63%. Low risk study.  Assessment   Essential hypertension - Plan: irbesartan-hydrochlorothiazide (AVALIDE) 300-12.5 MG tablet, Basic metabolic panel  Abnormal EKG   PCP EKG 09/25/2018: Normal sinus rhythm at 70 bpm, normal axis, cannot exclude inferior infarct old. Cannot exclude anterior infarct old. No evidence of ischemia. Abnormal EKG.  EKG 09/25/2018:  Normal sinus rhythm at 61 bpm, normal axis, no evidence of ischemia.   Recommendations:   I have reviewed the results of the echocardiogram which shows normal LV systolic function, mild LVH.  Also reviewed the results of the nuclear stress test, he has excellent exercise tolerance with no evidence of ischemia.  Fortunately he has not had any recurrence of chest pain.  I suspect the chest pain could have been related to hypertension with hypertensive heart disease.  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  In view of normal wall motion, do not suspect coronary artery disease or prior myocardial infarction.  His blood pressure is still elevated today he was at home and it was also noted to be high on his prior office visits with [place].  Advised him to increase the Avapro from 150 mg to 3 mg daily, will switch him to Avalide 300/12.5 mg in the morning.  He is to continue amlodipine.  Will avoid beta-blockers in view of bradycardia.  Will obtain a BMP in 2 weeks.  If BMP stable, he can follow-up with Dr. Kevan Ny for further management and evaluation. I spent approximately 13 minutes discussions regarding medications and test results and discussions regarding his symptoms of chest pain.  Adrian Prows, MD, [facility] 11/05/2018, 4:53 PM [facility]. Caroleen Pager: [phone] Office: [phone] If no answer Cell [phone]

## 2019-03-15 ENCOUNTER — Other Ambulatory Visit: Payer: Self-pay | Admitting: Cardiology

## 2019-03-15 DIAGNOSIS — I10 Essential (primary) hypertension: Secondary | ICD-10-CM

## 2019-04-12 ENCOUNTER — Other Ambulatory Visit: Payer: Self-pay | Admitting: Cardiology

## 2019-04-12 DIAGNOSIS — I10 Essential (primary) hypertension: Secondary | ICD-10-CM

## 2019-05-28 ENCOUNTER — Other Ambulatory Visit: Payer: Self-pay | Admitting: Cardiology

## 2019-05-28 DIAGNOSIS — I10 Essential (primary) hypertension: Secondary | ICD-10-CM

## 2019-07-25 ENCOUNTER — Ambulatory Visit: Payer: BC Managed Care – PPO

## 2019-12-09 ENCOUNTER — Other Ambulatory Visit: Payer: Self-pay | Admitting: Cardiology

## 2019-12-09 DIAGNOSIS — I10 Essential (primary) hypertension: Secondary | ICD-10-CM

## 2020-03-30 ENCOUNTER — Other Ambulatory Visit: Payer: Self-pay | Admitting: Cardiology

## 2020-03-30 DIAGNOSIS — I10 Essential (primary) hypertension: Secondary | ICD-10-CM

## 2020-06-26 ENCOUNTER — Other Ambulatory Visit: Payer: Self-pay | Admitting: Internal Medicine

## 2020-06-26 DIAGNOSIS — N281 Cyst of kidney, acquired: Secondary | ICD-10-CM

## 2020-07-08 ENCOUNTER — Other Ambulatory Visit: Payer: Self-pay

## 2020-07-08 ENCOUNTER — Ambulatory Visit
Admission: RE | Admit: 2020-07-08 | Discharge: 2020-07-08 | Disposition: A | Discharge status: Home / Self Care | Payer: BC Managed Care – PPO | Source: Ambulatory Visit | Attending: Internal Medicine | Admitting: Internal Medicine

## 2020-07-08 DIAGNOSIS — N281 Cyst of kidney, acquired: Secondary | ICD-10-CM

## 2020-10-21 ENCOUNTER — Other Ambulatory Visit: Payer: Self-pay | Admitting: Internal Medicine

## 2020-10-21 DIAGNOSIS — R109 Unspecified abdominal pain: Secondary | ICD-10-CM

## 2020-10-30 ENCOUNTER — Ambulatory Visit
Admission: RE | Admit: 2020-10-30 | Discharge: 2020-10-30 | Disposition: A | Discharge status: Home / Self Care | Payer: BC Managed Care – PPO | Source: Ambulatory Visit | Attending: Internal Medicine | Admitting: Internal Medicine

## 2020-10-30 DIAGNOSIS — R109 Unspecified abdominal pain: Secondary | ICD-10-CM

## 2020-10-30 MED ORDER — IOPAMIDOL (ISOVUE-370) INJECTION 76%
80.0000 mL | Freq: Once | INTRAVENOUS | Status: AC | PRN
  Administered 2020-10-30: 80 mL via INTRAVENOUS

## 2022-05-12 ENCOUNTER — Other Ambulatory Visit: Payer: Self-pay | Admitting: Internal Medicine

## 2022-05-12 ENCOUNTER — Ambulatory Visit
Admission: RE | Admit: 2022-05-12 | Discharge: 2022-05-12 | Disposition: A | Discharge status: Home / Self Care | Payer: BC Managed Care – PPO | Source: Ambulatory Visit | Attending: Internal Medicine | Admitting: Internal Medicine

## 2022-05-12 DIAGNOSIS — R062 Wheezing: Secondary | ICD-10-CM

## 2022-05-12 DIAGNOSIS — R059 Cough, unspecified: Secondary | ICD-10-CM

## 2022-08-03 IMAGING — US US RENAL
1 series · 14 of 25 positions shown · non-contrast
Comparison: CT 08/30/2013

CLINICAL DATA: Right-sided pain.  History of renal cysts

EXAM:
RENAL / URINARY TRACT ULTRASOUND COMPLETE

[Series 1: us renal · 0.23mm/px · 14 of 37 slices shown]
[im 1/37]
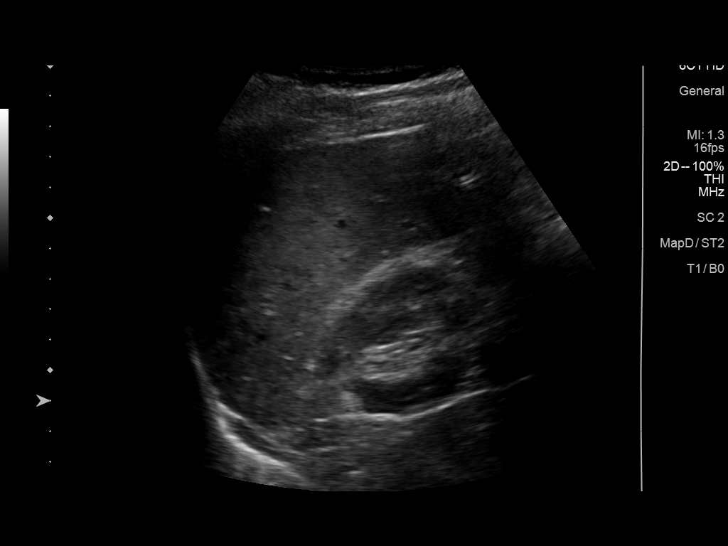
[im 4/37]
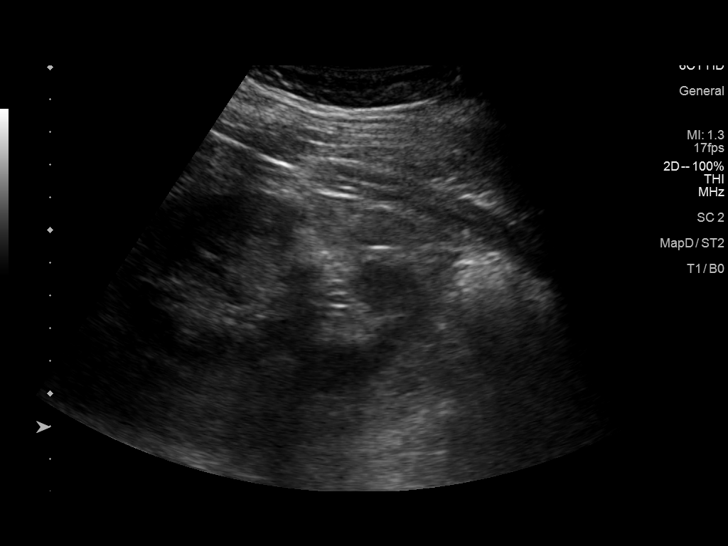
[im 7/37]
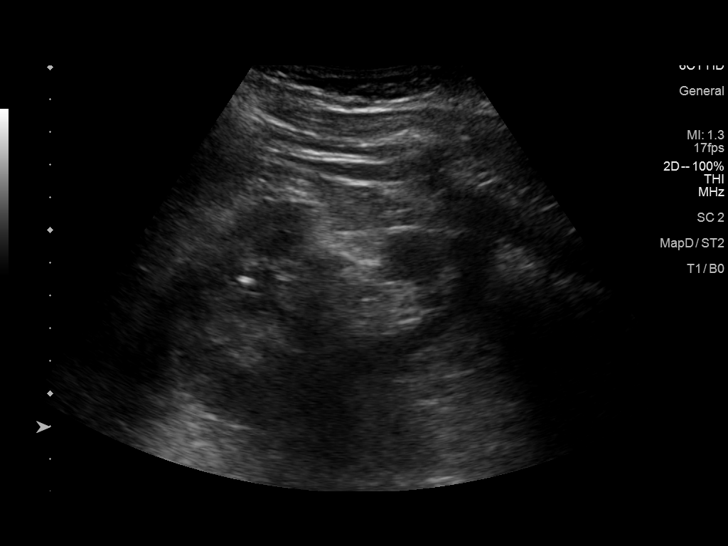
[im 10/37]
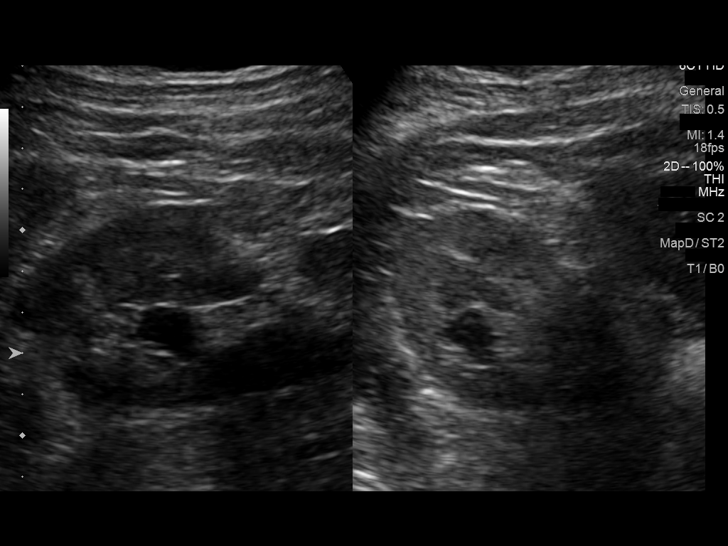
[im 13/37]
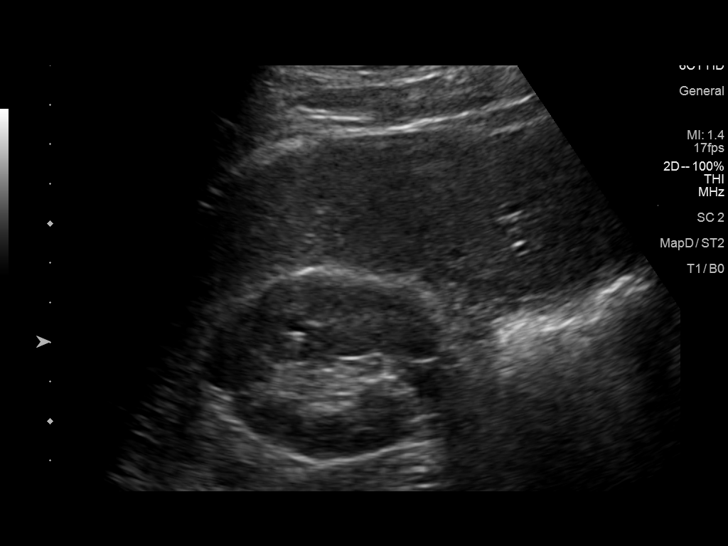
[im 14/37]
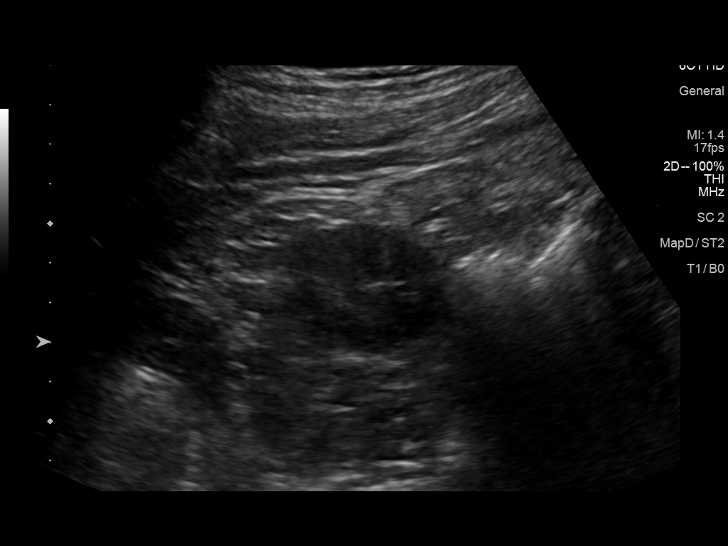
[im 17/37]
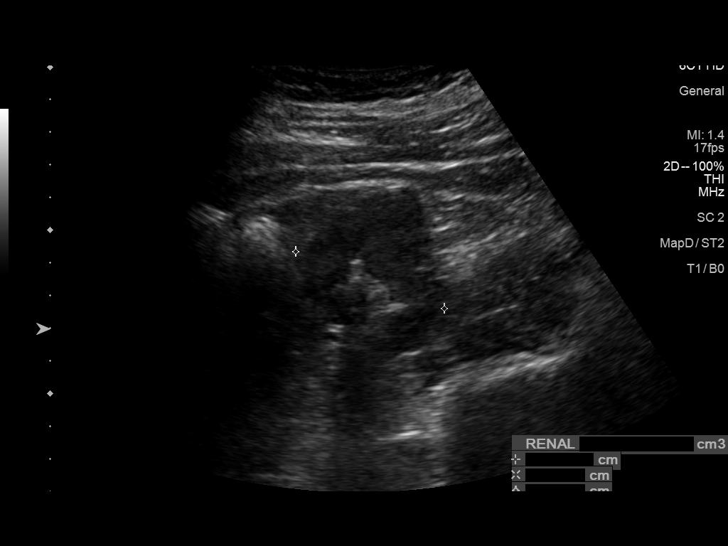
[im 20/37]
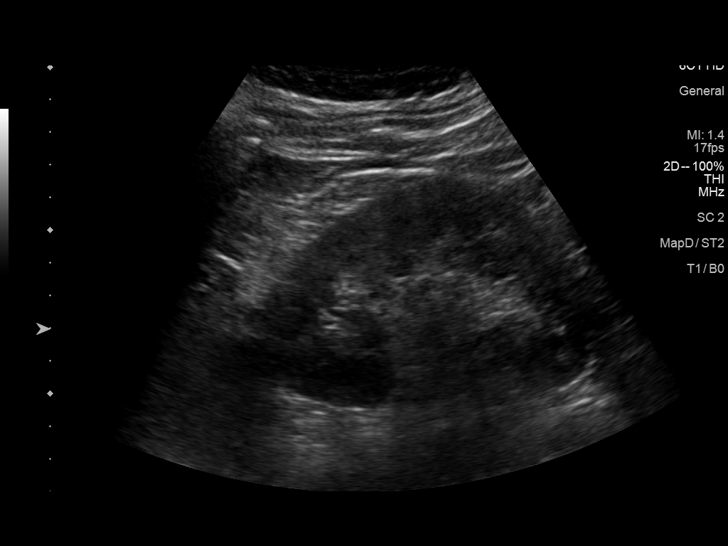
[im 23/37]
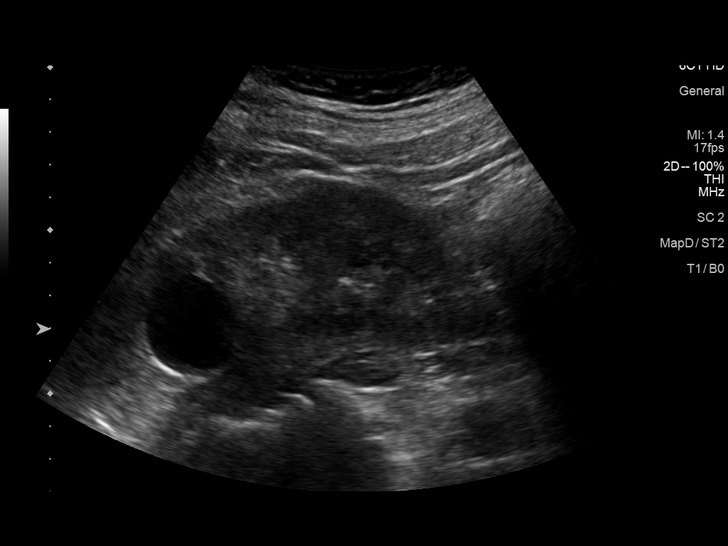
[im 25/37]
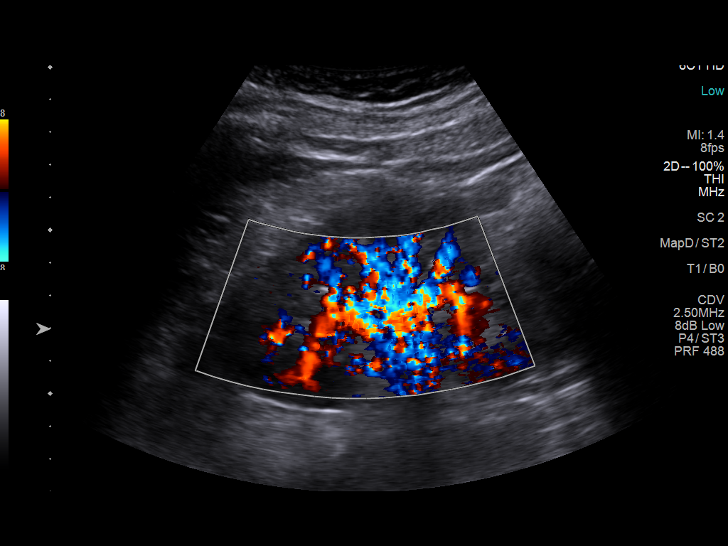
[im 28/37]
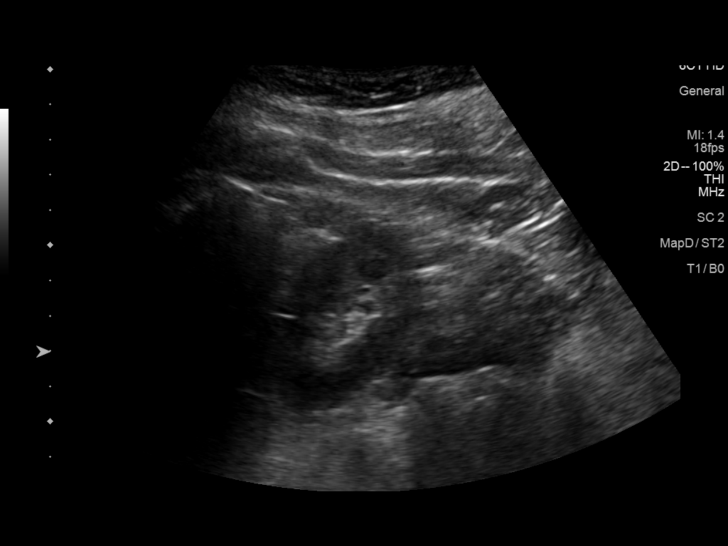
[im 31/37]
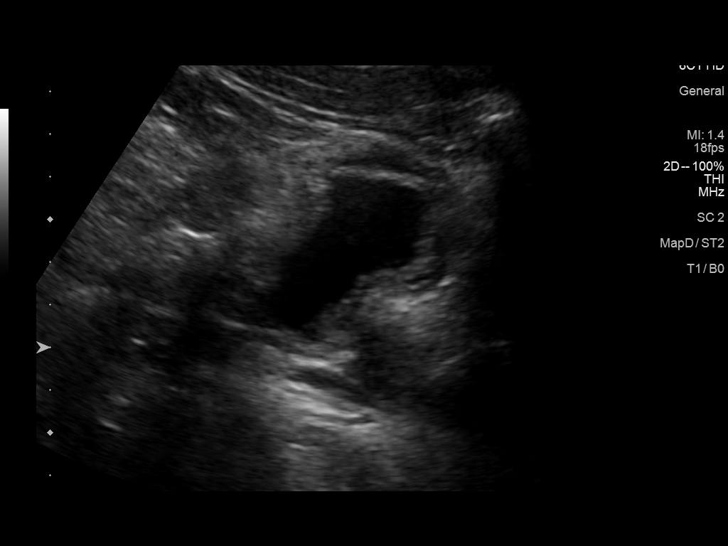
[im 34/37]
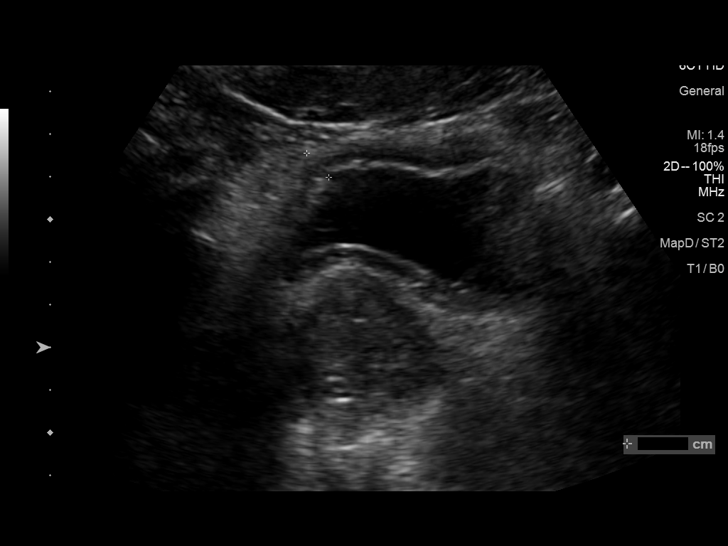
[im 37/37]
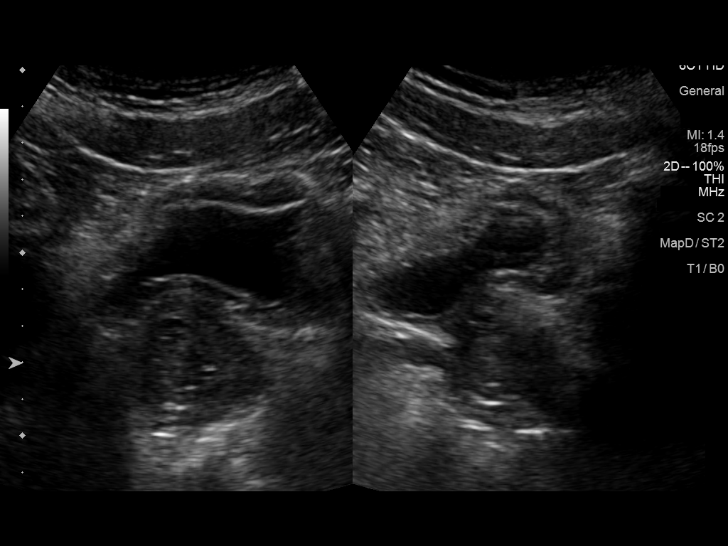

[14 of 25 positions shown; findings below may reference images not displayed]

FINDINGS: Right Kidney:

Renal measurements: 10.1 x 5.1 x 4.6 cm = volume: 124 mL. Focal area
of cortical thinning within the mid to lower pole of the right
kidney likely reflecting site of prior renal infarct. Echogenicity
within normal limits. 1.8 cm upper pole right renal cyst. No solid
mass, shadowing stone, or hydronephrosis visualized.

Left Kidney:

Renal measurements: 11.2 x 6.7 x 4.9 cm = volume: 191 mL.
Echogenicity within normal limits. 2.8 cm upper pole left renal
cyst. No solid mass, shadowing stone, or hydronephrosis visualized.

Bladder:

Mild diffuse bladder wall thickening measuring up to 8 mm.

Other:

Prostate gland measures approximately 3.3 x 4.6 x 3.0 cm (volume 24
mL).
IMPRESSION: 1. Negative for obstructive uropathy.
2. Area of right renal cortical scarring corresponds to site of
prior renal infarct.
3. Simple bilateral renal cysts.
4. Mild diffuse urinary bladder wall thickening, which could be
related to cystitis or chronic outlet obstruction. Correlate with
urinalysis.

## 2022-11-25 IMAGING — CT CT ABD-PELV W/ CM
1 of 3 series · 12 of 32 positions shown, 18 images · IV contrast (APPLIED)
Comparison: Renal ultrasound dated 07/08/2020 and CT abdomen pelvis
dated 08/30/2013.

CLINICAL DATA: 59-year-old male with right flank pain.

EXAM:
CT ABDOMEN AND PELVIS WITH CONTRAST
TECHNIQUE: Multidetector CT imaging of the abdomen and pelvis was performed
using the standard protocol following bolus administration of
intravenous contrast.
CONTRAST:  80mL ST9BZJ-F63 IOPAMIDOL (ST9BZJ-F63) INJECTION 76%

[Series 2: abd/pelvis w/cm · axial · 0.78mm/px · z∈[-494,-99]mm · 12 of 93 slices shown, 18 images]
[im 7/93  soft-tissue]
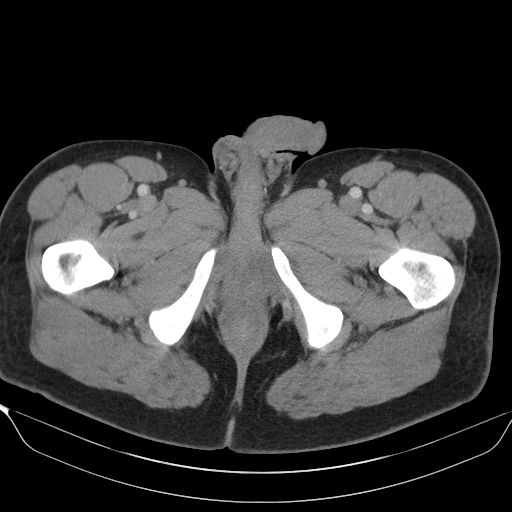
[im 7/93  bone]
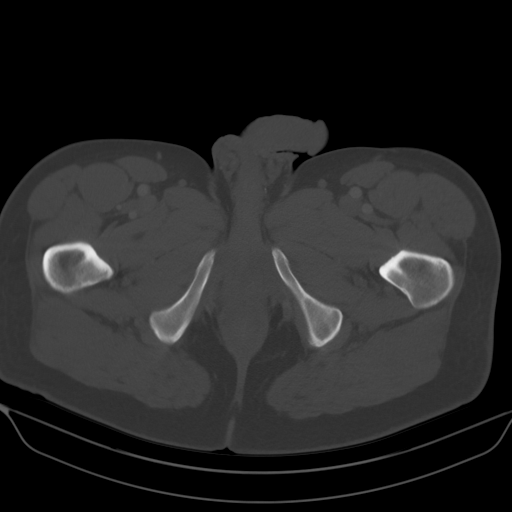
[im 14/93  soft-tissue]
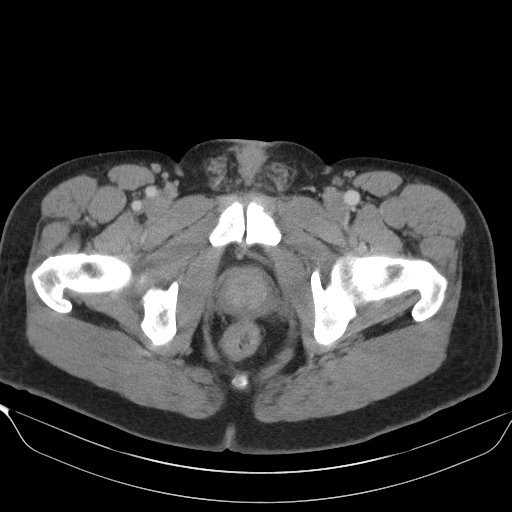
[im 20/93  soft-tissue]
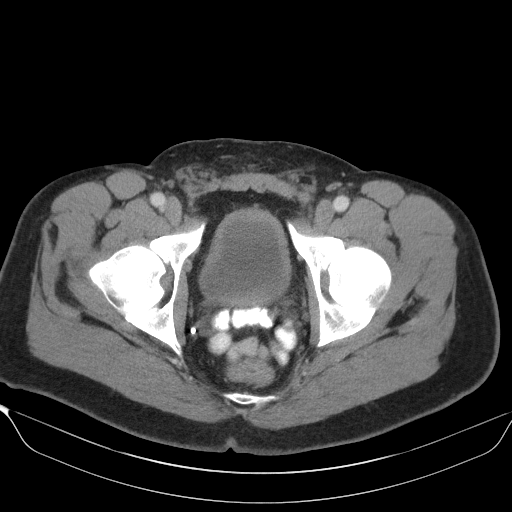
[im 27/93  soft-tissue]
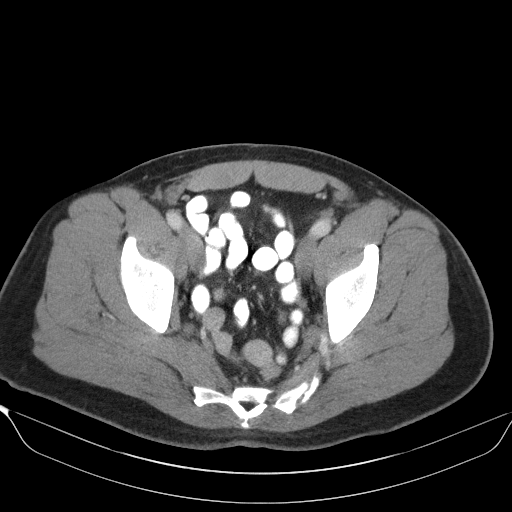
[im 33/93  soft-tissue]
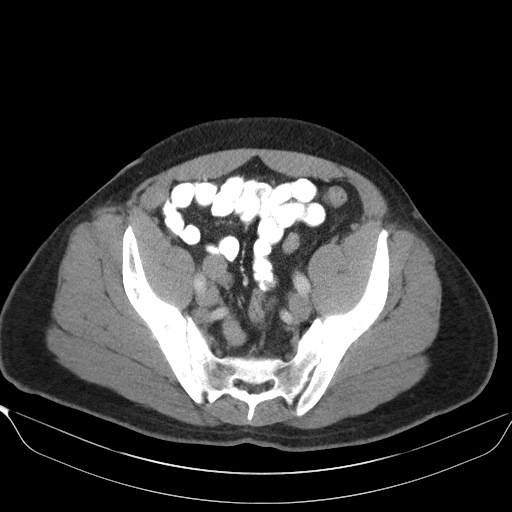
[im 40/93  soft-tissue]
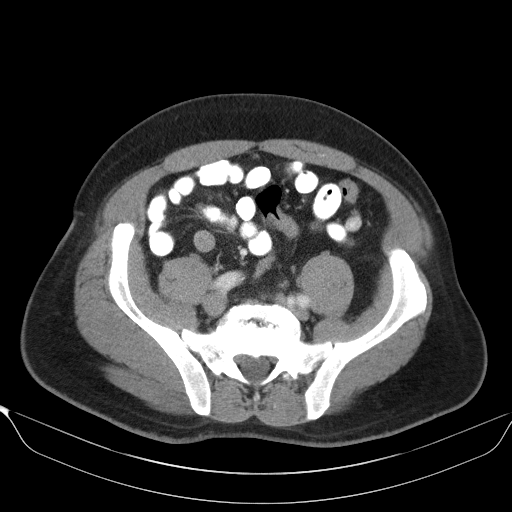
[im 53/93  soft-tissue]
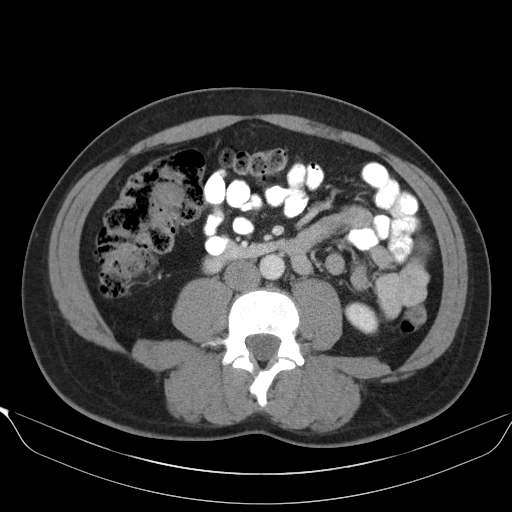
[im 60/93  soft-tissue]
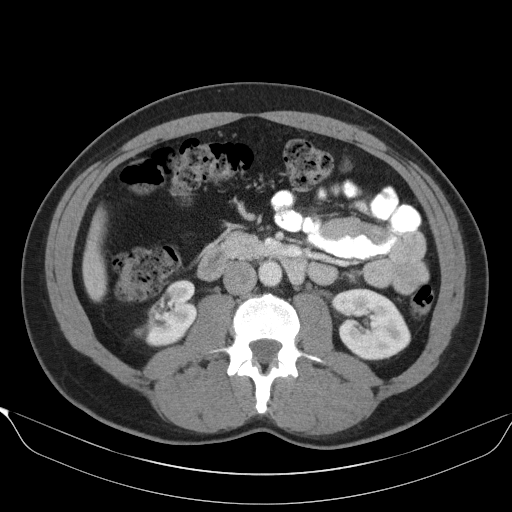
[im 66/93  soft-tissue]
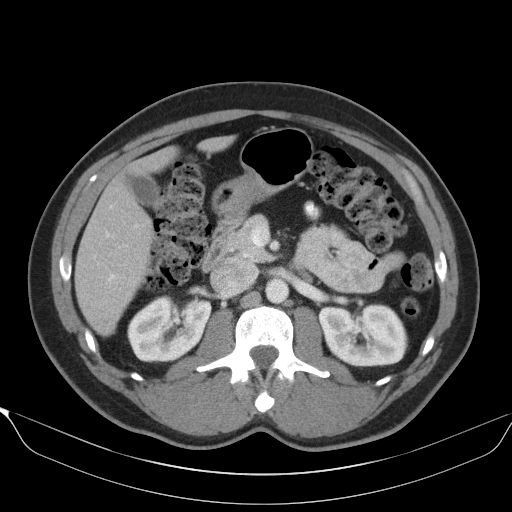
[im 66/93  lung]
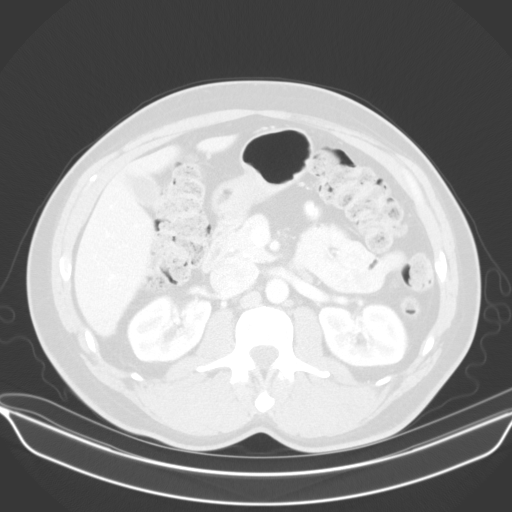
[im 66/93  bone]
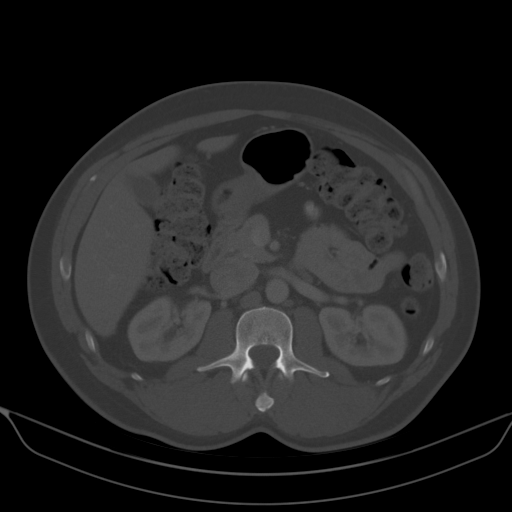
[im 73/93  soft-tissue]
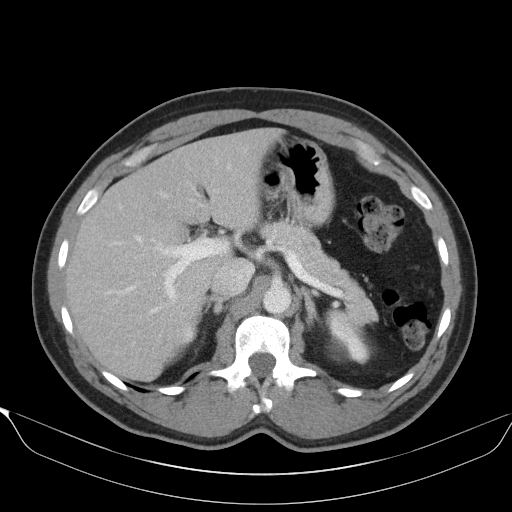
[im 73/93  lung]
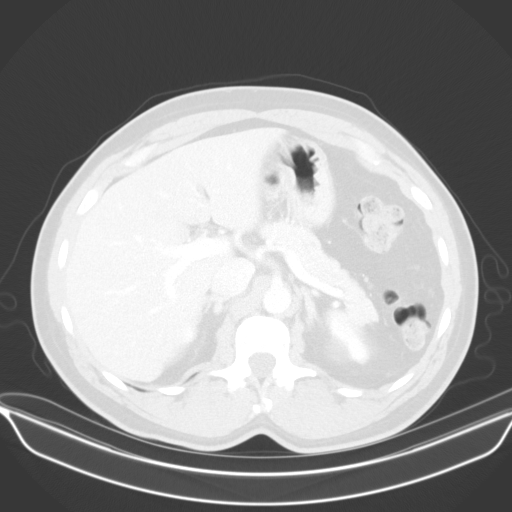
[im 79/93  soft-tissue]
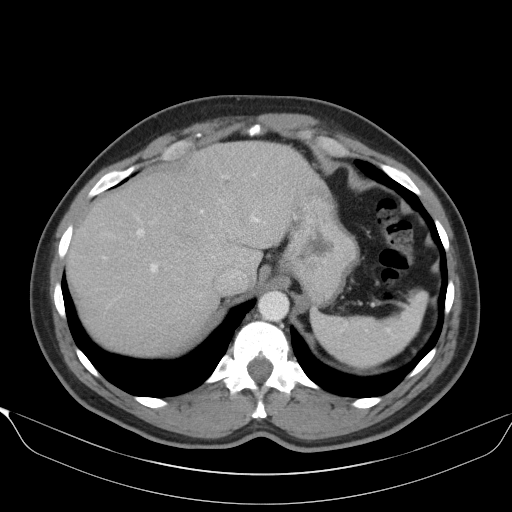
[im 79/93  lung]
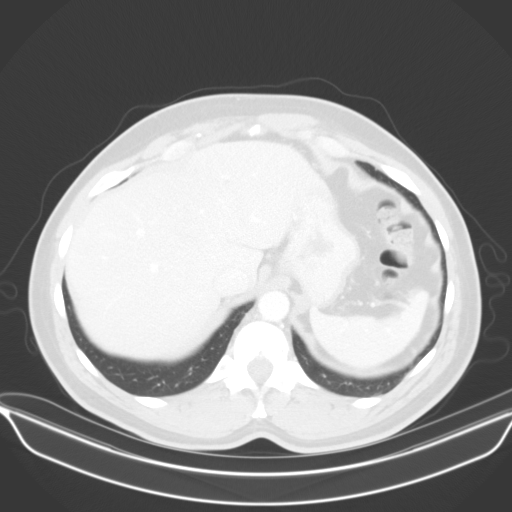
[im 86/93  soft-tissue]
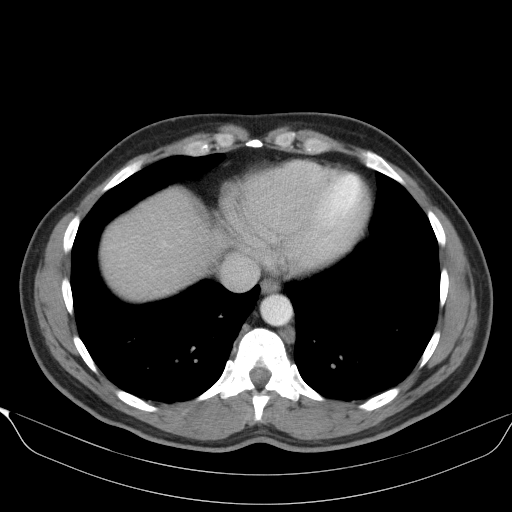
[im 86/93  lung]
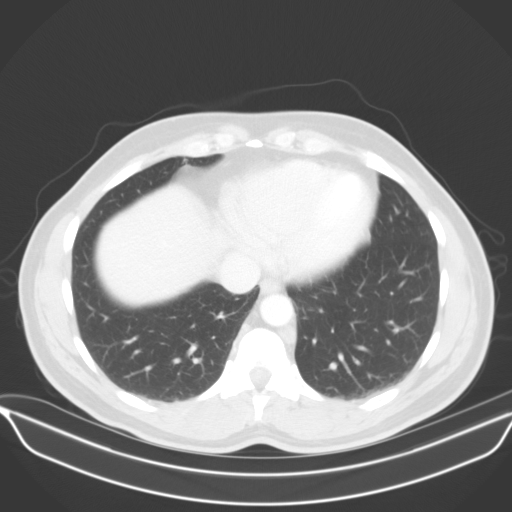

[12 of 32 positions shown; findings below may reference images not displayed]

FINDINGS: Lower chest: The visualized lung bases are clear.

No intra-abdominal free air or free fluid.

Hepatobiliary: Subcentimeter hypodense focus in the right lobe of
the liver is too small to characterize. The liver is otherwise
unremarkable. No intrahepatic biliary ductal dilatation. The
gallbladder is unremarkable.

Pancreas: Unremarkable. No pancreatic ductal dilatation or
surrounding inflammatory changes.

Spleen: Normal in size without focal abnormality.

Adrenals/Urinary Tract: The left adrenal glands unremarkable. There
is a 7 mm indeterminate right adrenal nodule, stable since 0393.
Areas of old infarct and scarring involving the inferior and
superior poles of the right kidney. There is a 3 cm left renal upper
pole cyst as well as additional smaller cysts bilaterally. There is
no hydronephrosis on either side. There is symmetric enhancement and
excretion of contrast by both kidneys. The visualized ureters and
urinary bladder appear unremarkable.

Stomach/Bowel: There is no bowel obstruction or active inflammation.
The appendix is normal.

Vascular/Lymphatic: Mild aortoiliac atherosclerotic disease. The IVC
is unremarkable. No portal venous gas. There is no adenopathy.

Reproductive: The prostate and seminal vesicles are grossly
unremarkable. No pelvic mass.

Other: Small fat containing umbilical hernia.

Musculoskeletal: Degenerative changes of the spine. No acute osseous
pathology.
IMPRESSION: 1. No acute intra-abdominal or pelvic pathology. No bowel
obstruction. Normal appendix.
2. Areas of old infarct and scarring involving the right kidney.
3. Aortic Atherosclerosis (ZVLSI-D58.8).

## 2023-03-24 ENCOUNTER — Other Ambulatory Visit: Payer: Self-pay | Admitting: Chiropractic Medicine

## 2023-03-24 ENCOUNTER — Ambulatory Visit
Admission: RE | Admit: 2023-03-24 | Discharge: 2023-03-24 | Disposition: A | Discharge status: Home / Self Care | Payer: BC Managed Care – PPO | Source: Ambulatory Visit | Attending: Chiropractic Medicine | Admitting: Chiropractic Medicine

## 2023-03-24 DIAGNOSIS — R2232 Localized swelling, mass and lump, left upper limb: Secondary | ICD-10-CM

## 2023-04-20 ENCOUNTER — Other Ambulatory Visit: Payer: Self-pay | Admitting: Chiropractic Medicine

## 2023-04-20 DIAGNOSIS — R2232 Localized swelling, mass and lump, left upper limb: Secondary | ICD-10-CM

## 2023-04-20 DIAGNOSIS — C4912 Malignant neoplasm of connective and soft tissue of left upper limb, including shoulder: Secondary | ICD-10-CM

## 2023-04-20 DIAGNOSIS — D1722 Benign lipomatous neoplasm of skin and subcutaneous tissue of left arm: Secondary | ICD-10-CM

## 2023-05-02 ENCOUNTER — Encounter: Payer: Self-pay | Admitting: Chiropractic Medicine

## 2023-05-12 ENCOUNTER — Ambulatory Visit
Admission: RE | Admit: 2023-05-12 | Discharge: 2023-05-12 | Disposition: A | Discharge status: Home / Self Care | Payer: BC Managed Care – PPO | Source: Ambulatory Visit | Attending: Chiropractic Medicine | Admitting: Chiropractic Medicine

## 2023-05-12 DIAGNOSIS — D1722 Benign lipomatous neoplasm of skin and subcutaneous tissue of left arm: Secondary | ICD-10-CM

## 2023-05-12 DIAGNOSIS — C4912 Malignant neoplasm of connective and soft tissue of left upper limb, including shoulder: Secondary | ICD-10-CM

## 2023-05-12 DIAGNOSIS — R2232 Localized swelling, mass and lump, left upper limb: Secondary | ICD-10-CM

## 2023-05-23 ENCOUNTER — Other Ambulatory Visit: Payer: Self-pay | Admitting: Internal Medicine

## 2023-05-23 ENCOUNTER — Ambulatory Visit
Admission: RE | Admit: 2023-05-23 | Discharge: 2023-05-23 | Disposition: A | Discharge status: Home / Self Care | Payer: 59 | Source: Ambulatory Visit | Attending: Internal Medicine | Admitting: Internal Medicine

## 2023-05-23 DIAGNOSIS — M79622 Pain in left upper arm: Secondary | ICD-10-CM

## 2023-05-23 DIAGNOSIS — M5412 Radiculopathy, cervical region: Secondary | ICD-10-CM

## 2023-06-12 ENCOUNTER — Other Ambulatory Visit: Payer: BC Managed Care – PPO

## 2023-07-31 ENCOUNTER — Other Ambulatory Visit (HOSPITAL_COMMUNITY): Payer: Self-pay | Admitting: Internal Medicine

## 2023-07-31 DIAGNOSIS — R2232 Localized swelling, mass and lump, left upper limb: Secondary | ICD-10-CM

## 2023-08-11 NOTE — Progress Notes (Signed)
 Gilmer Mor, DO  Claudean Kinds; [place] [facility] Procedure Requests Discussed with Dr. August Saucer.  OK for US guided left arm mass biopsy.  MRI 07/10/23  Acknowledges chance of false negative/non-diagnostic sample.   Patient is also seeing ortho at same time.  [address]       Previous Messages    ----- Message ----- From: Claudean Kinds Sent: 08/10/2023  11:13 AM EDT To: Claudean Kinds; [facility] Procedure Requests Subject: [place] FNA SOFT TISSUE                            Procedure : [place] FNA SOFT TISSUE  Reason : Rule out liposarcoma. Patient with enlarged left shoulder mass not clearly defined by MRI scan Dx: Mass of skin of left shoulder [R22.32 (ICD-10-CM)]    History : MRI left humerus ( in PACS under Ernestina Patches )  Provider : Gwenyth Bender, MD  Provider contact : [phone]

## 2023-08-14 ENCOUNTER — Other Ambulatory Visit: Payer: Self-pay | Admitting: Radiology

## 2023-08-14 NOTE — H&P (Signed)
 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  Referring Provider(s):  Dr. Willey Blade, MD  Supervising Physician: Ruel Favors  Patient Status: [name] - Out-pt  Patient is Full Code  History of Present Illness: Nicholas Pace is a 63 y.o. male with PMHx notable for HTN and HLD.  Patient is followed by Dr. Willey Blade for progressively enlarging left shoulder mass, not well characterized on MRI at [facility].  MRI Humerus left shoulder on 07/10/23: IMPRESSION: Lipoid lession associated with the deltoid muscle appears similar to prior. There is no obvious nodular enhancement. Differential considerations continue to include complex intermuscular lipoma or low-grade liposarcoma.  Interventional Radiology was requested for FNA biopsy of left shoulder mass. Patient is scheduled for same in IR today.  All labs and medications are within acceptable parameters. No pertinent allergies. Patient has been NPO since midnight.    Currently without any significant complaints. Patient alert and laying in bed,calm. Denies any fevers, headache, chest pain, SOB, cough, abdominal pain, nausea, vomiting or bleeding.     Past Medical History:  Diagnosis Date   Hyperlipidemia    Hypertension     Past Surgical History:  Procedure Laterality Date   HERNIA REPAIR      Allergies: Patient has no known allergies.  Medications: Prior to Admission medications   Medication Sig Start Date End Date Taking? Authorizing Provider  amLODipine (NORVASC) 10 MG tablet Take 10 mg by mouth daily.    [provider]  Ascorbic Acid (VITAMIN C) 250 MG CHEW Chew 1 tablet by mouth as needed.     [provider]  aspirin EC 81 MG EC tablet Take 1 tablet (81 mg total) by mouth daily. 09/01/13   Hongalgi, Maximino Greenland, MD  atorvastatin (LIPITOR) 20 MG tablet Take 20 mg by mouth daily.    [provider]   irbesartan-hydrochlorothiazide (AVALIDE) 300-12.5 MG tablet TAKE 1 TABLET BY MOUTH ONCE DAILY IN THE MORNING 12/09/19   Yates Decamp, MD  Multiple Vitamin (MULTI-VITAMIN PO) Take 1 tablet by mouth daily. Reported on 06/15/2015    [provider]  nitroGLYCERIN (NITROSTAT) 0.4 MG SL tablet Place 1 tablet (0.4 mg total) under the tongue every 5 (five) minutes as needed for chest pain. 09/25/18   Toniann Fail, NP     Family History  Problem Relation Age of Onset   CAD Neg Hx     Social History   Socioeconomic History   Marital status: Married    Spouse name: Not on file   Number of children: 2   Years of education: Not on file   Highest education level: Not on file  Occupational History   Not on file  Tobacco Use   Smoking status: Never   Smokeless tobacco: Never  Substance and Sexual Activity   Alcohol use: No   Drug use: No   Sexual activity: Not on file  Other Topics Concern   Not on file  Social History Narrative   Not on file   Social Drivers of Health   Financial Resource Strain: Not on file  Food Insecurity: Not on file  Transportation Needs: Not on file  Physical Activity: Not on file  Stress: Not on file  Social Connections: Not on file     Review of Systems: A 12 point ROS discussed and pertinent positives are indicated in the HPI above.  All other systems are negative.  Vital Signs: BP (!) 128/98   Pulse 72   Temp 98.2 F (36.8 C)   Resp 18   Ht 6\' 6"  (1.981 m)   Wt 231 lb (104.8 kg)   SpO2 98%   BMI 26.69 kg/m   Advance Care Plan: The advanced care place/surrogate decision maker was discussed at the time of visit and the patient did not wish to discuss or was not able to name a surrogate decision maker or provide an advance care plan.  Physical Exam Vitals reviewed.  Constitutional:      General: He is not in acute distress.    Appearance: Normal appearance.  HENT:     Mouth/Throat:     Mouth: Mucous membranes are moist.   Cardiovascular:     Rate and Rhythm: Normal rate and regular rhythm.     Pulses: Normal pulses.     Heart sounds: No murmur heard. Pulmonary:     Effort: Pulmonary effort is normal. No respiratory distress.     Breath sounds: Normal breath sounds.  Abdominal:     General: Abdomen is flat.     Tenderness: There is no abdominal tenderness.  Musculoskeletal:        General: Normal range of motion.  Skin:    General: Skin is warm and dry.  Neurological:     Mental Status: He is alert and oriented to person, place, and time.  Psychiatric:        Mood and Affect: Mood normal.        Behavior: Behavior normal.        Thought Content: Thought content normal.        Judgment: Judgment normal.     Imaging: No results found.  Labs:  CBC: No results for input(s): "WBC", "HGB", "HCT", "PLT" in the last 8760 hours.  COAGS: No results for input(s): "INR", "APTT" in the last 8760 hours.  BMP: No results for input(s): "NA", "K", "CL", "CO2", "GLUCOSE", "BUN", "CALCIUM", "CREATININE", "GFRNONAA", "GFRAA" in the last 8760 hours.  Invalid input(s): "CMP"  LIVER FUNCTION TESTS: No results for input(s): "BILITOT", "AST", "ALT", "ALKPHOS", "PROT", "ALBUMIN" in the last 8760 hours.  TUMOR MARKERS: No results for input(s): "AFPTM", "CEA", "CA199", "CHROMGRNA" in the last 8760 hours.  Assessment and Plan: Patient is followed by Dr. Willey Blade for progressively enlarging left shoulder mass, not well characterized on MRI at [facility].  Patient presents for scheduled shoulder lesion biopsy in IR today.  Risks and benefits of shoulder lesion biopsy was discussed with the patient and/or patient's family including, but not limited to bleeding, infection, damage to adjacent structures or low yield requiring additional tests.  All of the questions were answered and there is agreement to proceed.  Consent signed and in chart.     Thank you for allowing our service to participate in Nicholas Pace  's care.  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

## 2023-08-15 ENCOUNTER — Other Ambulatory Visit: Payer: Self-pay

## 2023-08-15 ENCOUNTER — Ambulatory Visit (HOSPITAL_COMMUNITY)
Admission: RE | Admit: 2023-08-15 | Discharge: 2023-08-15 | Disposition: A | Discharge status: Home / Self Care | Source: Ambulatory Visit | Attending: Internal Medicine | Admitting: Internal Medicine

## 2023-08-15 ENCOUNTER — Encounter (HOSPITAL_COMMUNITY): Payer: Self-pay

## 2023-08-15 DIAGNOSIS — R2232 Localized swelling, mass and lump, left upper limb: Secondary | ICD-10-CM

## 2023-08-15 NOTE — Progress Notes (Signed)
 Patient ID: Nicholas Pace, male   DOB: 1960-12-18, 63 y.o.   MRN: [id number]  Biopsy of left shoulder fatty lesion canceled after discussion with the patient.  These type of Bxs can often be low yield and nondiagnostic on fatty lesions.  He was not informed of this prior to being scheduled.  He would like to re consider observation vs surgical referral for excisional bx vs removal

## 2023-08-15 NOTE — Sedation Documentation (Signed)
 Patient decided not to proceed with biopsy. Cancelled procedure. Removed patient's IV and contacted his wife that he is ready for discharge.
